# Patient Record
Sex: Female | Born: 1975
Health system: Southern US, Community
[De-identification: ages and names within clinical notes are randomized; demographics above are authoritative.]

## PROBLEM LIST (undated history)

## (undated) DIAGNOSIS — I10 Essential (primary) hypertension: Secondary | ICD-10-CM

## (undated) DIAGNOSIS — D649 Anemia, unspecified: Secondary | ICD-10-CM

## (undated) HISTORY — DX: Essential (primary) hypertension: I10

## (undated) HISTORY — PX: BREAST CYST EXCISION: SHX579

## (undated) HISTORY — DX: Anemia, unspecified: D64.9

---

## 2018-02-05 DIAGNOSIS — L0291 Cutaneous abscess, unspecified: Secondary | ICD-10-CM | POA: Diagnosis not present

## 2018-02-05 DIAGNOSIS — H11009 Unspecified pterygium of unspecified eye: Secondary | ICD-10-CM | POA: Diagnosis not present

## 2018-02-05 DIAGNOSIS — H1589 Other disorders of sclera: Secondary | ICD-10-CM | POA: Diagnosis not present

## 2018-02-05 DIAGNOSIS — I10 Essential (primary) hypertension: Secondary | ICD-10-CM | POA: Diagnosis not present

## 2018-02-06 DIAGNOSIS — I10 Essential (primary) hypertension: Secondary | ICD-10-CM | POA: Diagnosis not present

## 2018-05-24 DIAGNOSIS — Z131 Encounter for screening for diabetes mellitus: Secondary | ICD-10-CM | POA: Diagnosis not present

## 2018-05-24 DIAGNOSIS — I10 Essential (primary) hypertension: Secondary | ICD-10-CM | POA: Diagnosis not present

## 2018-05-24 DIAGNOSIS — Z13220 Encounter for screening for lipoid disorders: Secondary | ICD-10-CM | POA: Diagnosis not present

## 2018-05-24 DIAGNOSIS — D649 Anemia, unspecified: Secondary | ICD-10-CM | POA: Diagnosis not present

## 2019-02-24 DIAGNOSIS — Z13220 Encounter for screening for lipoid disorders: Secondary | ICD-10-CM | POA: Diagnosis not present

## 2019-02-24 DIAGNOSIS — R071 Chest pain on breathing: Secondary | ICD-10-CM | POA: Diagnosis not present

## 2019-02-24 DIAGNOSIS — Z131 Encounter for screening for diabetes mellitus: Secondary | ICD-10-CM | POA: Diagnosis not present

## 2019-02-24 DIAGNOSIS — M25569 Pain in unspecified knee: Secondary | ICD-10-CM | POA: Diagnosis not present

## 2019-02-24 DIAGNOSIS — E559 Vitamin D deficiency, unspecified: Secondary | ICD-10-CM | POA: Diagnosis not present

## 2019-02-24 DIAGNOSIS — I10 Essential (primary) hypertension: Secondary | ICD-10-CM | POA: Diagnosis not present

## 2019-02-25 ENCOUNTER — Other Ambulatory Visit: Payer: Self-pay | Admitting: Internal Medicine

## 2019-02-25 DIAGNOSIS — Z1231 Encounter for screening mammogram for malignant neoplasm of breast: Secondary | ICD-10-CM

## 2019-04-10 ENCOUNTER — Ambulatory Visit
Admission: RE | Admit: 2019-04-10 | Discharge: 2019-04-10 | Disposition: A | Discharge status: Home / Self Care | Payer: BC Managed Care – PPO | Source: Ambulatory Visit | Attending: Internal Medicine | Admitting: Internal Medicine

## 2019-04-10 ENCOUNTER — Other Ambulatory Visit: Payer: Self-pay

## 2019-04-10 DIAGNOSIS — Z1231 Encounter for screening mammogram for malignant neoplasm of breast: Secondary | ICD-10-CM | POA: Diagnosis not present

## 2019-04-14 ENCOUNTER — Other Ambulatory Visit: Payer: Self-pay | Admitting: Internal Medicine

## 2019-04-14 DIAGNOSIS — R928 Other abnormal and inconclusive findings on diagnostic imaging of breast: Secondary | ICD-10-CM

## 2019-04-18 ENCOUNTER — Ambulatory Visit
Admission: RE | Admit: 2019-04-18 | Discharge: 2019-04-18 | Disposition: A | Discharge status: Home / Self Care | Payer: BC Managed Care – PPO | Source: Ambulatory Visit | Attending: Internal Medicine | Admitting: Internal Medicine

## 2019-04-18 ENCOUNTER — Other Ambulatory Visit: Payer: Self-pay

## 2019-04-18 DIAGNOSIS — R928 Other abnormal and inconclusive findings on diagnostic imaging of breast: Secondary | ICD-10-CM

## 2019-04-18 DIAGNOSIS — R922 Inconclusive mammogram: Secondary | ICD-10-CM | POA: Diagnosis not present

## 2019-04-18 DIAGNOSIS — N6002 Solitary cyst of left breast: Secondary | ICD-10-CM | POA: Diagnosis not present

## 2019-05-31 DIAGNOSIS — Z03818 Encounter for observation for suspected exposure to other biological agents ruled out: Secondary | ICD-10-CM | POA: Diagnosis not present

## 2019-06-24 DIAGNOSIS — Z03818 Encounter for observation for suspected exposure to other biological agents ruled out: Secondary | ICD-10-CM | POA: Diagnosis not present

## 2019-07-07 DIAGNOSIS — Z03818 Encounter for observation for suspected exposure to other biological agents ruled out: Secondary | ICD-10-CM | POA: Diagnosis not present

## 2019-07-14 DIAGNOSIS — Z03818 Encounter for observation for suspected exposure to other biological agents ruled out: Secondary | ICD-10-CM | POA: Diagnosis not present

## 2019-08-06 DIAGNOSIS — Z03818 Encounter for observation for suspected exposure to other biological agents ruled out: Secondary | ICD-10-CM | POA: Diagnosis not present

## 2019-08-11 DIAGNOSIS — Z03818 Encounter for observation for suspected exposure to other biological agents ruled out: Secondary | ICD-10-CM | POA: Diagnosis not present

## 2019-08-18 DIAGNOSIS — Z03818 Encounter for observation for suspected exposure to other biological agents ruled out: Secondary | ICD-10-CM | POA: Diagnosis not present

## 2019-08-25 DIAGNOSIS — Z03818 Encounter for observation for suspected exposure to other biological agents ruled out: Secondary | ICD-10-CM | POA: Diagnosis not present

## 2019-10-27 DIAGNOSIS — Z03818 Encounter for observation for suspected exposure to other biological agents ruled out: Secondary | ICD-10-CM | POA: Diagnosis not present

## 2019-11-05 DIAGNOSIS — Z1159 Encounter for screening for other viral diseases: Secondary | ICD-10-CM | POA: Diagnosis not present

## 2019-11-05 DIAGNOSIS — Z20828 Contact with and (suspected) exposure to other viral communicable diseases: Secondary | ICD-10-CM | POA: Diagnosis not present

## 2019-11-12 DIAGNOSIS — Z20828 Contact with and (suspected) exposure to other viral communicable diseases: Secondary | ICD-10-CM | POA: Diagnosis not present

## 2019-11-12 DIAGNOSIS — Z1159 Encounter for screening for other viral diseases: Secondary | ICD-10-CM | POA: Diagnosis not present

## 2019-11-19 DIAGNOSIS — Z20828 Contact with and (suspected) exposure to other viral communicable diseases: Secondary | ICD-10-CM | POA: Diagnosis not present

## 2019-11-19 DIAGNOSIS — Z1159 Encounter for screening for other viral diseases: Secondary | ICD-10-CM | POA: Diagnosis not present

## 2019-12-03 DIAGNOSIS — Z20828 Contact with and (suspected) exposure to other viral communicable diseases: Secondary | ICD-10-CM | POA: Diagnosis not present

## 2019-12-03 DIAGNOSIS — Z1159 Encounter for screening for other viral diseases: Secondary | ICD-10-CM | POA: Diagnosis not present

## 2019-12-10 DIAGNOSIS — Z1159 Encounter for screening for other viral diseases: Secondary | ICD-10-CM | POA: Diagnosis not present

## 2019-12-10 DIAGNOSIS — Z20828 Contact with and (suspected) exposure to other viral communicable diseases: Secondary | ICD-10-CM | POA: Diagnosis not present

## 2019-12-17 DIAGNOSIS — Z1159 Encounter for screening for other viral diseases: Secondary | ICD-10-CM | POA: Diagnosis not present

## 2019-12-17 DIAGNOSIS — Z20828 Contact with and (suspected) exposure to other viral communicable diseases: Secondary | ICD-10-CM | POA: Diagnosis not present

## 2019-12-24 DIAGNOSIS — Z20828 Contact with and (suspected) exposure to other viral communicable diseases: Secondary | ICD-10-CM | POA: Diagnosis not present

## 2019-12-24 DIAGNOSIS — Z1159 Encounter for screening for other viral diseases: Secondary | ICD-10-CM | POA: Diagnosis not present

## 2019-12-31 DIAGNOSIS — Z20828 Contact with and (suspected) exposure to other viral communicable diseases: Secondary | ICD-10-CM | POA: Diagnosis not present

## 2019-12-31 DIAGNOSIS — Z1159 Encounter for screening for other viral diseases: Secondary | ICD-10-CM | POA: Diagnosis not present

## 2020-01-07 DIAGNOSIS — Z1159 Encounter for screening for other viral diseases: Secondary | ICD-10-CM | POA: Diagnosis not present

## 2020-01-07 DIAGNOSIS — Z20828 Contact with and (suspected) exposure to other viral communicable diseases: Secondary | ICD-10-CM | POA: Diagnosis not present

## 2020-01-21 DIAGNOSIS — Z1159 Encounter for screening for other viral diseases: Secondary | ICD-10-CM | POA: Diagnosis not present

## 2020-01-21 DIAGNOSIS — Z20828 Contact with and (suspected) exposure to other viral communicable diseases: Secondary | ICD-10-CM | POA: Diagnosis not present

## 2020-01-28 DIAGNOSIS — Z1159 Encounter for screening for other viral diseases: Secondary | ICD-10-CM | POA: Diagnosis not present

## 2020-01-28 DIAGNOSIS — Z20828 Contact with and (suspected) exposure to other viral communicable diseases: Secondary | ICD-10-CM | POA: Diagnosis not present

## 2020-02-04 DIAGNOSIS — Z20828 Contact with and (suspected) exposure to other viral communicable diseases: Secondary | ICD-10-CM | POA: Diagnosis not present

## 2020-02-04 DIAGNOSIS — Z1159 Encounter for screening for other viral diseases: Secondary | ICD-10-CM | POA: Diagnosis not present

## 2020-02-10 DIAGNOSIS — I10 Essential (primary) hypertension: Secondary | ICD-10-CM | POA: Diagnosis not present

## 2020-02-10 DIAGNOSIS — Z0000 Encounter for general adult medical examination without abnormal findings: Secondary | ICD-10-CM | POA: Diagnosis not present

## 2020-02-10 DIAGNOSIS — Z1239 Encounter for other screening for malignant neoplasm of breast: Secondary | ICD-10-CM | POA: Diagnosis not present

## 2020-02-10 DIAGNOSIS — Z124 Encounter for screening for malignant neoplasm of cervix: Secondary | ICD-10-CM | POA: Diagnosis not present

## 2020-02-10 DIAGNOSIS — Z131 Encounter for screening for diabetes mellitus: Secondary | ICD-10-CM | POA: Diagnosis not present

## 2020-02-10 DIAGNOSIS — D649 Anemia, unspecified: Secondary | ICD-10-CM | POA: Diagnosis not present

## 2020-02-16 DIAGNOSIS — I10 Essential (primary) hypertension: Secondary | ICD-10-CM | POA: Diagnosis not present

## 2020-02-16 DIAGNOSIS — Z13220 Encounter for screening for lipoid disorders: Secondary | ICD-10-CM | POA: Diagnosis not present

## 2020-02-16 DIAGNOSIS — N3080 Other cystitis without hematuria: Secondary | ICD-10-CM | POA: Diagnosis not present

## 2020-02-16 DIAGNOSIS — N3081 Other cystitis with hematuria: Secondary | ICD-10-CM | POA: Diagnosis not present

## 2020-02-16 DIAGNOSIS — E559 Vitamin D deficiency, unspecified: Secondary | ICD-10-CM | POA: Diagnosis not present

## 2020-02-16 DIAGNOSIS — Z0000 Encounter for general adult medical examination without abnormal findings: Secondary | ICD-10-CM | POA: Diagnosis not present

## 2020-02-18 DIAGNOSIS — Z20828 Contact with and (suspected) exposure to other viral communicable diseases: Secondary | ICD-10-CM | POA: Diagnosis not present

## 2020-02-18 DIAGNOSIS — Z1159 Encounter for screening for other viral diseases: Secondary | ICD-10-CM | POA: Diagnosis not present

## 2020-02-25 DIAGNOSIS — Z20828 Contact with and (suspected) exposure to other viral communicable diseases: Secondary | ICD-10-CM | POA: Diagnosis not present

## 2020-02-25 DIAGNOSIS — Z1159 Encounter for screening for other viral diseases: Secondary | ICD-10-CM | POA: Diagnosis not present

## 2020-02-26 ENCOUNTER — Encounter: Payer: Self-pay | Admitting: Radiology

## 2020-03-03 DIAGNOSIS — Z1159 Encounter for screening for other viral diseases: Secondary | ICD-10-CM | POA: Diagnosis not present

## 2020-03-03 DIAGNOSIS — Z20828 Contact with and (suspected) exposure to other viral communicable diseases: Secondary | ICD-10-CM | POA: Diagnosis not present

## 2020-03-10 DIAGNOSIS — Z1159 Encounter for screening for other viral diseases: Secondary | ICD-10-CM | POA: Diagnosis not present

## 2020-03-10 DIAGNOSIS — Z20828 Contact with and (suspected) exposure to other viral communicable diseases: Secondary | ICD-10-CM | POA: Diagnosis not present

## 2020-03-17 ENCOUNTER — Encounter: Payer: BC Managed Care – PPO | Admitting: Family Medicine

## 2020-03-23 ENCOUNTER — Encounter: Payer: BC Managed Care – PPO | Admitting: Obstetrics and Gynecology

## 2020-03-24 DIAGNOSIS — Z1159 Encounter for screening for other viral diseases: Secondary | ICD-10-CM | POA: Diagnosis not present

## 2020-03-24 DIAGNOSIS — Z20828 Contact with and (suspected) exposure to other viral communicable diseases: Secondary | ICD-10-CM | POA: Diagnosis not present

## 2020-04-05 DIAGNOSIS — M5489 Other dorsalgia: Secondary | ICD-10-CM | POA: Diagnosis not present

## 2020-04-05 DIAGNOSIS — I10 Essential (primary) hypertension: Secondary | ICD-10-CM | POA: Diagnosis not present

## 2020-04-05 DIAGNOSIS — E669 Obesity, unspecified: Secondary | ICD-10-CM | POA: Diagnosis not present

## 2020-04-05 DIAGNOSIS — D649 Anemia, unspecified: Secondary | ICD-10-CM | POA: Diagnosis not present

## 2020-04-07 DIAGNOSIS — Z1159 Encounter for screening for other viral diseases: Secondary | ICD-10-CM | POA: Diagnosis not present

## 2020-04-07 DIAGNOSIS — Z20828 Contact with and (suspected) exposure to other viral communicable diseases: Secondary | ICD-10-CM | POA: Diagnosis not present

## 2020-04-13 DIAGNOSIS — Z20828 Contact with and (suspected) exposure to other viral communicable diseases: Secondary | ICD-10-CM | POA: Diagnosis not present

## 2020-04-13 DIAGNOSIS — Z1159 Encounter for screening for other viral diseases: Secondary | ICD-10-CM | POA: Diagnosis not present

## 2020-04-15 ENCOUNTER — Encounter: Payer: Self-pay | Admitting: Family Medicine

## 2020-04-15 ENCOUNTER — Other Ambulatory Visit (HOSPITAL_COMMUNITY)
Admission: RE | Admit: 2020-04-15 | Discharge: 2020-04-15 | Disposition: A | Discharge status: Home / Self Care | Payer: BC Managed Care – PPO | Source: Ambulatory Visit | Attending: Family Medicine | Admitting: Family Medicine

## 2020-04-15 ENCOUNTER — Other Ambulatory Visit: Payer: Self-pay

## 2020-04-15 ENCOUNTER — Ambulatory Visit (INDEPENDENT_AMBULATORY_CARE_PROVIDER_SITE_OTHER): Payer: BC Managed Care – PPO | Admitting: Family Medicine

## 2020-04-15 DIAGNOSIS — Z01419 Encounter for gynecological examination (general) (routine) without abnormal findings: Secondary | ICD-10-CM

## 2020-04-15 DIAGNOSIS — Z124 Encounter for screening for malignant neoplasm of cervix: Secondary | ICD-10-CM

## 2020-04-15 DIAGNOSIS — I10 Essential (primary) hypertension: Secondary | ICD-10-CM | POA: Insufficient documentation

## 2020-04-15 NOTE — Progress Notes (Signed)
  Subjective:     Vanessa Gates is a 44 y.o. female and is here for a comprehensive physical exam. The patient reports no problems.   The following portions of the patient's history were reviewed and updated as appropriate: allergies, current medications, past family history, past medical history, past social history, past surgical history and problem list.  Review of Systems Pertinent items noted in HPI and remainder of comprehensive ROS otherwise negative.   Objective:    BP (!) 142/98   Pulse 98   Ht 5\' 5"  (1.651 m)   Wt 185 lb (83.9 kg)   LMP 03/31/2020 (Exact Date)   BMI 30.79 kg/m  General appearance: alert, cooperative and appears stated age Head: Normocephalic, without obvious abnormality, atraumatic Neck: no adenopathy, supple, symmetrical, trachea midline and thyroid not enlarged, symmetric, no tenderness/mass/nodules Lungs: clear to auscultation bilaterally Breasts: normal appearance, no masses or tenderness Heart: regular rate and rhythm, S1, S2 normal, no murmur, click, rub or gallop Abdomen: soft, non-tender; bowel sounds normal; no masses,  no organomegaly Pelvic: cervix normal in appearance, external genitalia normal, no adnexal masses or tenderness, no cervical motion tenderness, uterus normal size, shape, and consistency and vagina normal without discharge Extremities: extremities normal, atraumatic, no cyanosis or edema Pulses: 2+ and symmetric Skin: Skin color, texture, turgor normal. No rashes or lesions Lymph nodes: Cervical, supraclavicular, and axillary nodes normal. Neurologic: Grossly normal    Assessment:    Healthy female exam.      Plan:  Screening for malignant neoplasm of cervix - Plan: Cytology - PAP  Encounter for gynecological examination without abnormal finding  Benign essential hypertension  On Norvasc--has PCP Declines mammogram and flu shot today Return in 1 year (on 04/15/2021).     See After Visit Summary for Counseling  Recommendations

## 2020-04-15 NOTE — Patient Instructions (Signed)
 Preventive Care 21-43 Years Old, Female Preventive care refers to visits with your health care provider and lifestyle choices that can promote health and wellness. This includes:  A yearly physical exam. This may also be called an annual well check.  Regular dental visits and eye exams.  Immunizations.  Screening for certain conditions.  Healthy lifestyle choices, such as eating a healthy diet, getting regular exercise, not using drugs or products that contain nicotine and tobacco, and limiting alcohol use. What can I expect for my preventive care visit? Physical exam Your health care provider will check your:  Height and weight. This may be used to calculate body mass index (BMI), which tells if you are at a healthy weight.  Heart rate and blood pressure.  Skin for abnormal spots. Counseling Your health care provider may ask you questions about your:  Alcohol, tobacco, and drug use.  Emotional well-being.  Home and relationship well-being.  Sexual activity.  Eating habits.  Work and work environment.  Method of birth control.  Menstrual cycle.  Pregnancy history. What immunizations do I need?  Influenza (flu) vaccine  This is recommended every year. Tetanus, diphtheria, and pertussis (Tdap) vaccine  You may need a Td booster every 10 years. Varicella (chickenpox) vaccine  You may need this if you have not been vaccinated. Human papillomavirus (HPV) vaccine  If recommended by your health care provider, you may need three doses over 6 months. Measles, mumps, and rubella (MMR) vaccine  You may need at least one dose of MMR. You may also need a second dose. Meningococcal conjugate (MenACWY) vaccine  One dose is recommended if you are age 19-21 years and a first-year college student living in a residence hall, or if you have one of several medical conditions. You may also need additional booster doses. Pneumococcal conjugate (PCV13) vaccine  You may need  this if you have certain conditions and were not previously vaccinated. Pneumococcal polysaccharide (PPSV23) vaccine  You may need one or two doses if you smoke cigarettes or if you have certain conditions. Hepatitis A vaccine  You may need this if you have certain conditions or if you travel or work in places where you may be exposed to hepatitis A. Hepatitis B vaccine  You may need this if you have certain conditions or if you travel or work in places where you may be exposed to hepatitis B. Haemophilus influenzae type b (Hib) vaccine  You may need this if you have certain conditions. You may receive vaccines as individual doses or as more than one vaccine together in one shot (combination vaccines). Talk with your health care provider about the risks and benefits of combination vaccines. What tests do I need?  Blood tests  Lipid and cholesterol levels. These may be checked every 5 years starting at age 20.  Hepatitis C test.  Hepatitis B test. Screening  Diabetes screening. This is done by checking your blood sugar (glucose) after you have not eaten for a while (fasting).  Sexually transmitted disease (STD) testing.  BRCA-related cancer screening. This may be done if you have a family history of breast, ovarian, tubal, or peritoneal cancers.  Pelvic exam and Pap test. This may be done every 3 years starting at age 21. Starting at age 30, this may be done every 5 years if you have a Pap test in combination with an HPV test. Talk with your health care provider about your test results, treatment options, and if necessary, the need for more   tests. Follow these instructions at home: Eating and drinking   Eat a diet that includes fresh fruits and vegetables, whole grains, lean protein, and low-fat dairy.  Take vitamin and mineral supplements as recommended by your health care provider.  Do not drink alcohol if: ? Your health care provider tells you not to drink. ? You are  pregnant, may be pregnant, or are planning to become pregnant.  If you drink alcohol: ? Limit how much you have to 0-1 drink a day. ? Be aware of how much alcohol is in your drink. In the [place] one drink equals one 12 oz bottle of beer (355 mL), one 5 oz glass of wine (148 mL), or one 1 oz glass of hard liquor (44 mL). Lifestyle  Take daily care of your teeth and gums.  Stay active. Exercise for at least 30 minutes on 5 or more days each week.  Do not use any products that contain nicotine or tobacco, such as cigarettes, e-cigarettes, and chewing tobacco. If you need help quitting, ask your health care provider.  If you are sexually active, practice safe sex. Use a condom or other form of birth control (contraception) in order to prevent pregnancy and STIs (sexually transmitted infections). If you plan to become pregnant, see your health care provider for a preconception visit. What's next?  Visit your health care provider once a year for a well check visit.  Ask your health care provider how often you should have your eyes and teeth checked.  Stay up to date on all vaccines. This information is not intended to replace advice given to you by your health care provider. Make sure you discuss any questions you have with your health care provider. Document Revised: 03/21/2018 Document Reviewed: 03/21/2018 Elsevier Patient Education  2020 [employer]  

## 2020-04-16 DIAGNOSIS — Z20828 Contact with and (suspected) exposure to other viral communicable diseases: Secondary | ICD-10-CM | POA: Diagnosis not present

## 2020-04-16 DIAGNOSIS — Z1159 Encounter for screening for other viral diseases: Secondary | ICD-10-CM | POA: Diagnosis not present

## 2020-04-19 LAB — CYTOLOGY - PAP
Adequacy: ABSENT
Comment: NEGATIVE
Diagnosis: NEGATIVE
High risk HPV: NEGATIVE

## 2020-04-20 DIAGNOSIS — Z20828 Contact with and (suspected) exposure to other viral communicable diseases: Secondary | ICD-10-CM | POA: Diagnosis not present

## 2020-04-20 DIAGNOSIS — Z1159 Encounter for screening for other viral diseases: Secondary | ICD-10-CM | POA: Diagnosis not present

## 2020-04-23 DIAGNOSIS — Z1159 Encounter for screening for other viral diseases: Secondary | ICD-10-CM | POA: Diagnosis not present

## 2020-04-23 DIAGNOSIS — Z20828 Contact with and (suspected) exposure to other viral communicable diseases: Secondary | ICD-10-CM | POA: Diagnosis not present

## 2020-04-23 IMAGING — MG MM DIGITAL DIAGNOSTIC UNILAT*L* W/ TOMO W/ CAD
8 series · 8 of 24 positions shown · non-contrast
Comparison: Previous exam(s).

CLINICAL DATA: 42-year-old female for further evaluation of
possible LEFT breast mass and LEFT breast asymmetry.

EXAM:
DIGITAL DIAGNOSTIC LEFT MAMMOGRAM WITH CAD AND TOMO
ULTRASOUND LEFT BREAST

[L ML synth-2D]
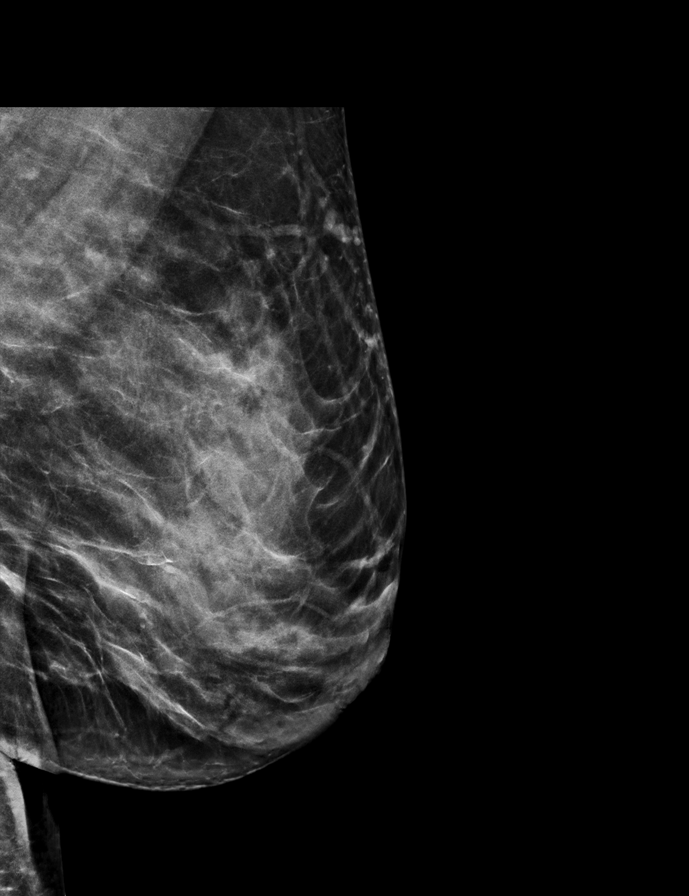

[L MLO synth-2D]
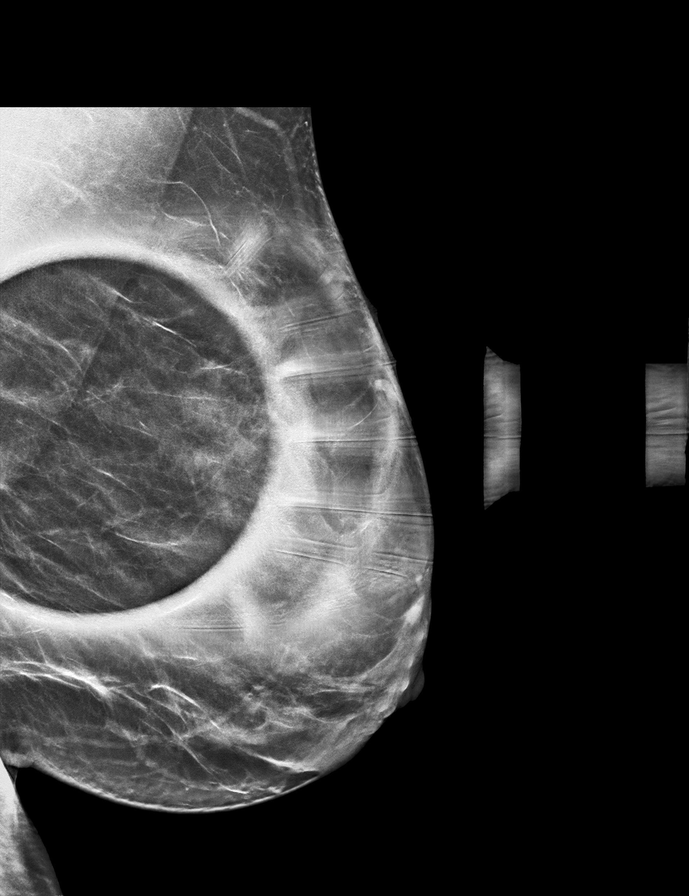

[L CC synth-2D (1 of 2)]
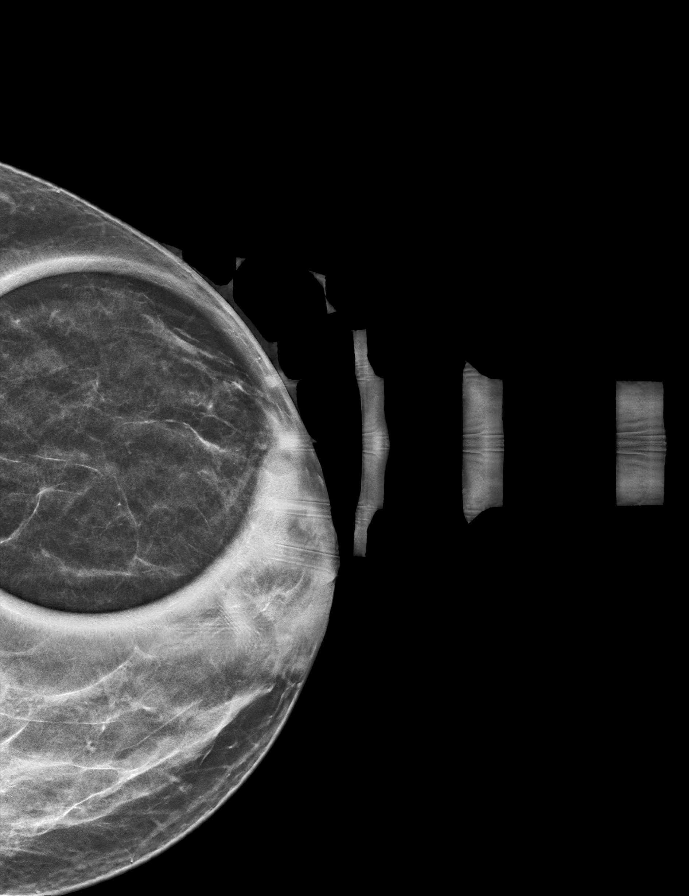

[L CC synth-2D (2 of 2)]
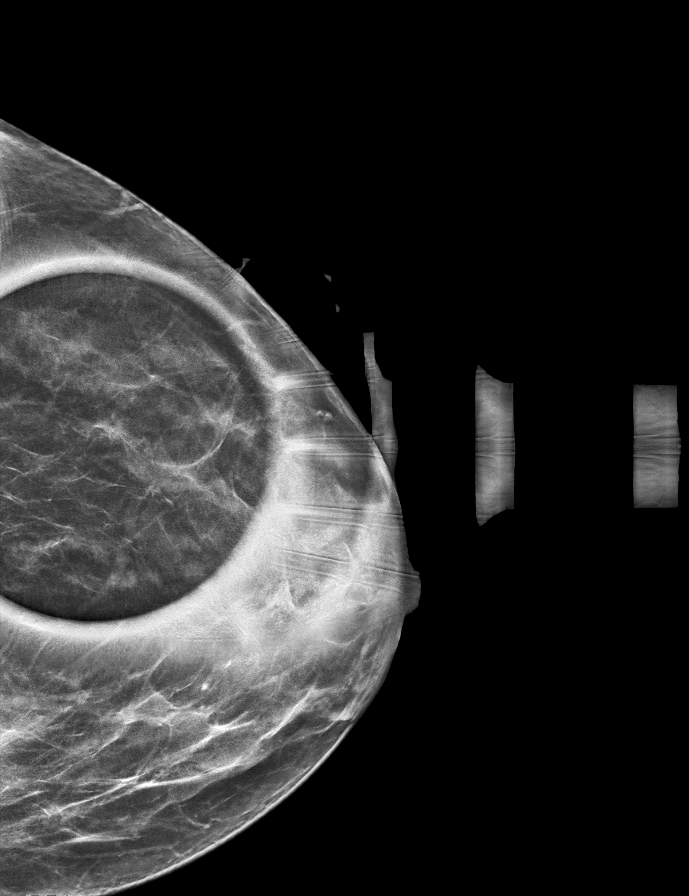

[L ML tomo · tomo slice 35/68.0]
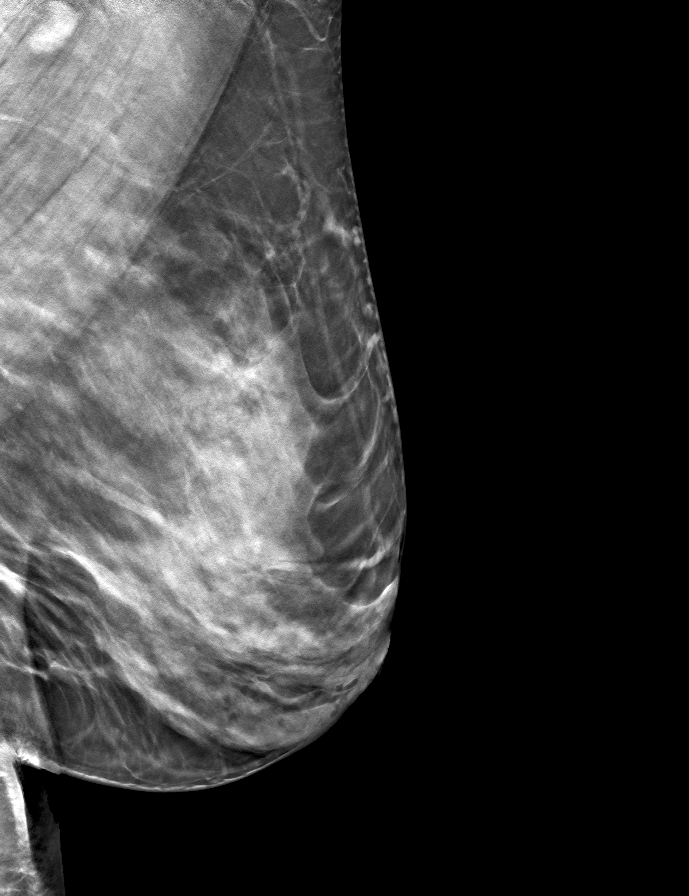

[L MLO tomo · tomo slice 33/65.0]
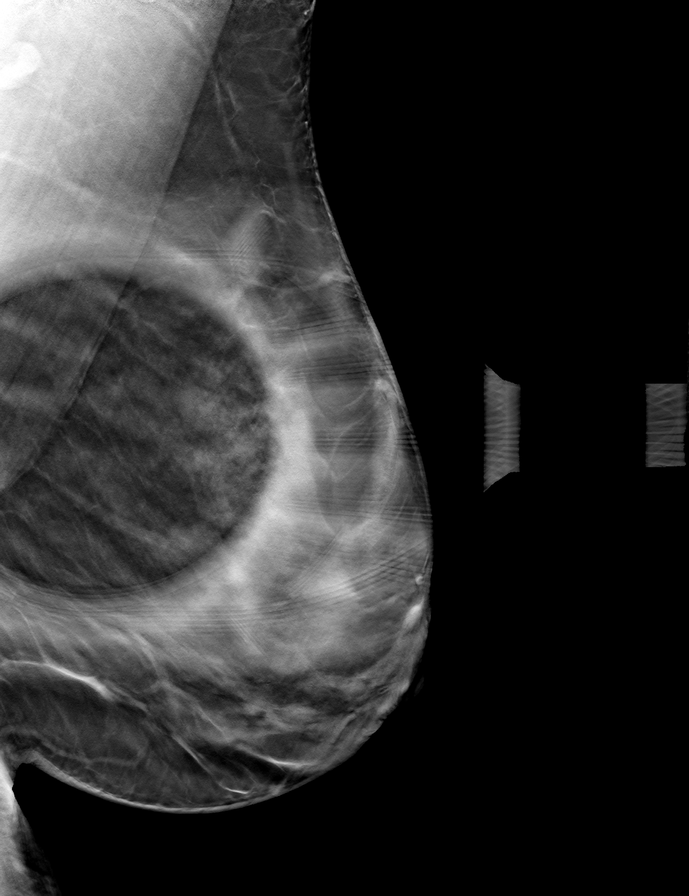

[L CC tomo (1 of 2) · tomo slice 28/55.0]
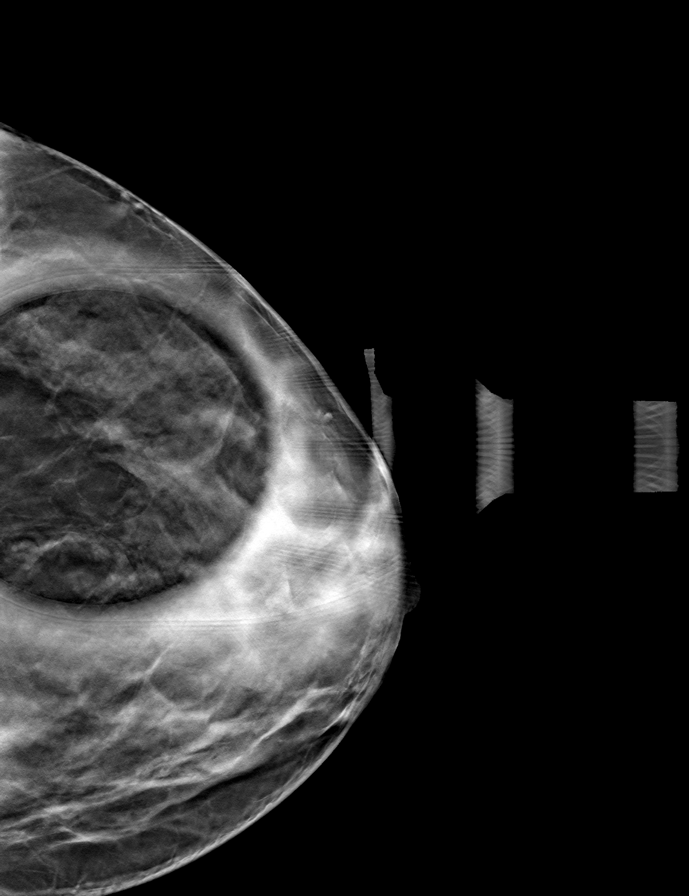

[L CC tomo (2 of 2) · tomo slice 21/42.0]
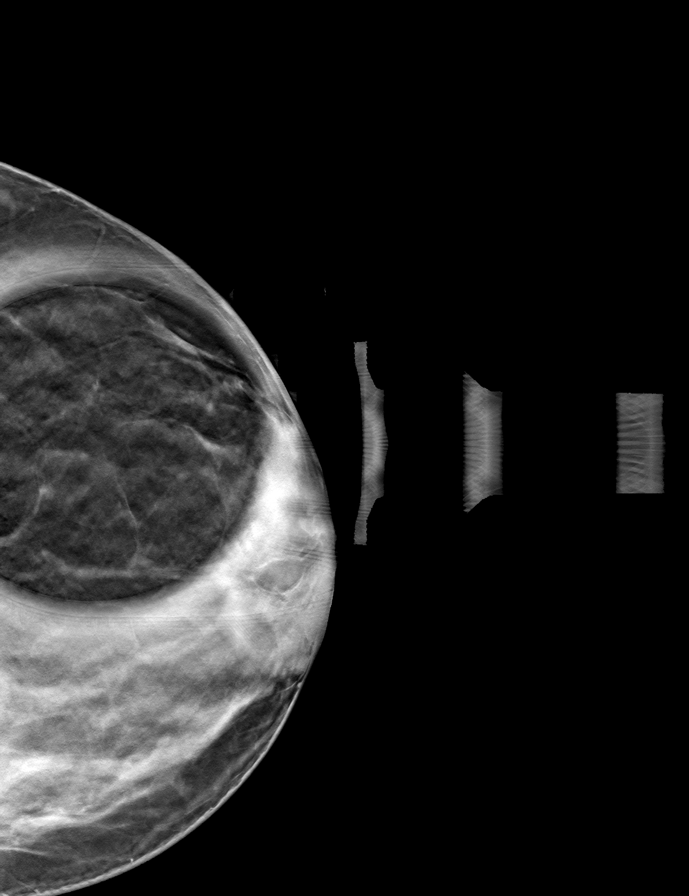

[8 of 24 positions shown; findings below may reference images not displayed]

ACR Breast Density Category c: The breast tissue is heterogeneously
dense, which may obscure small masses.
FINDINGS: 2D/3D full field and spot compression views of the LEFT breast
demonstrate a persistent circumscribed oval mass within the OUTER
LEFT breast.

No persistent suspicious abnormality is identified within the
posterior LEFT breast in the area of the possible screening study
asymmetry.

Mammographic images were processed with CAD.

Targeted ultrasound is performed, showing a 0.6 x 0.6 x 0.9 cm
benign cyst at the 3 o'clock position of the LEFT breast 7 cm from
the nipple, corresponding to the screening study finding.
IMPRESSION: 1. Benign cyst within the OUTER LEFT breast corresponding to the
screening study finding.
2. No persistent abnormality at the site of the possible screening
study asymmetry within the posterior LEFT breast .

RECOMMENDATION:
Bilateral screening mammogram in 1 year.

I have discussed the findings and recommendations with the patient.
If applicable, a reminder letter will be sent to the patient
regarding the next appointment.

BI-RADS CATEGORY  2: Benign.

## 2020-04-23 IMAGING — US US BREAST*L* LIMITED INC AXILLA
1 series · 5 of 5 positions shown · non-contrast
Comparison: Previous exam(s).

CLINICAL DATA: 42-year-old female for further evaluation of
possible LEFT breast mass and LEFT breast asymmetry.

EXAM:
DIGITAL DIAGNOSTIC LEFT MAMMOGRAM WITH CAD AND TOMO
ULTRASOUND LEFT BREAST

[Series 1: us breast*left* limited inc axilla · 0.06mm/px · 5 of 5 slices shown]
[im 1/5]
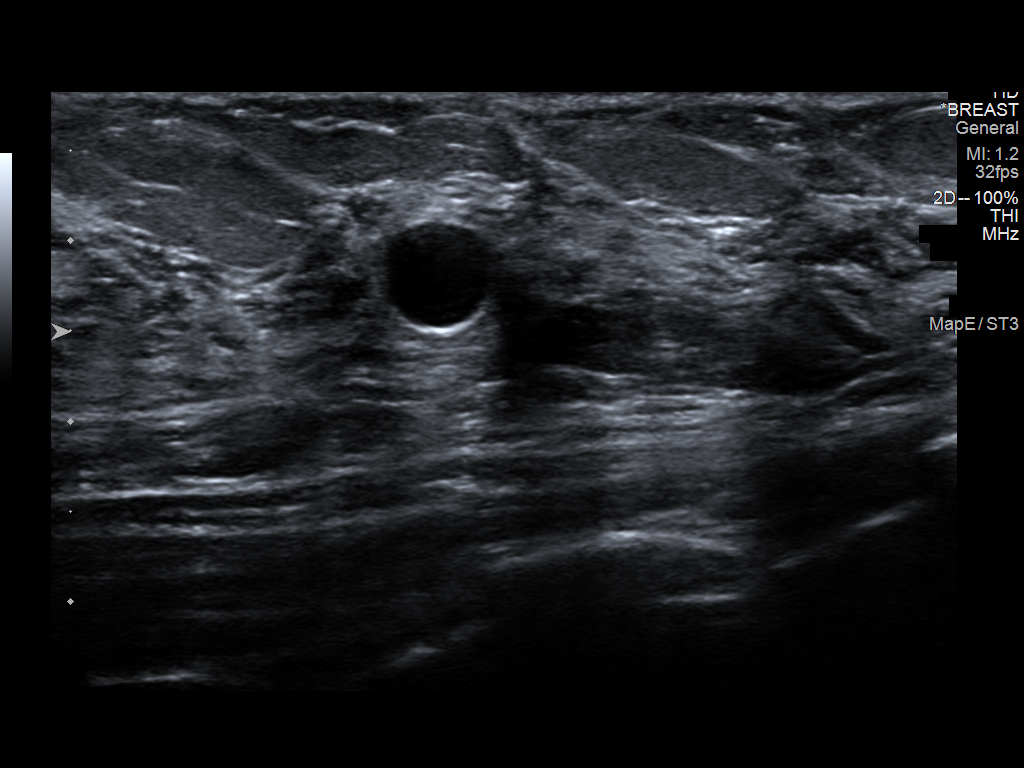
[im 2/5]
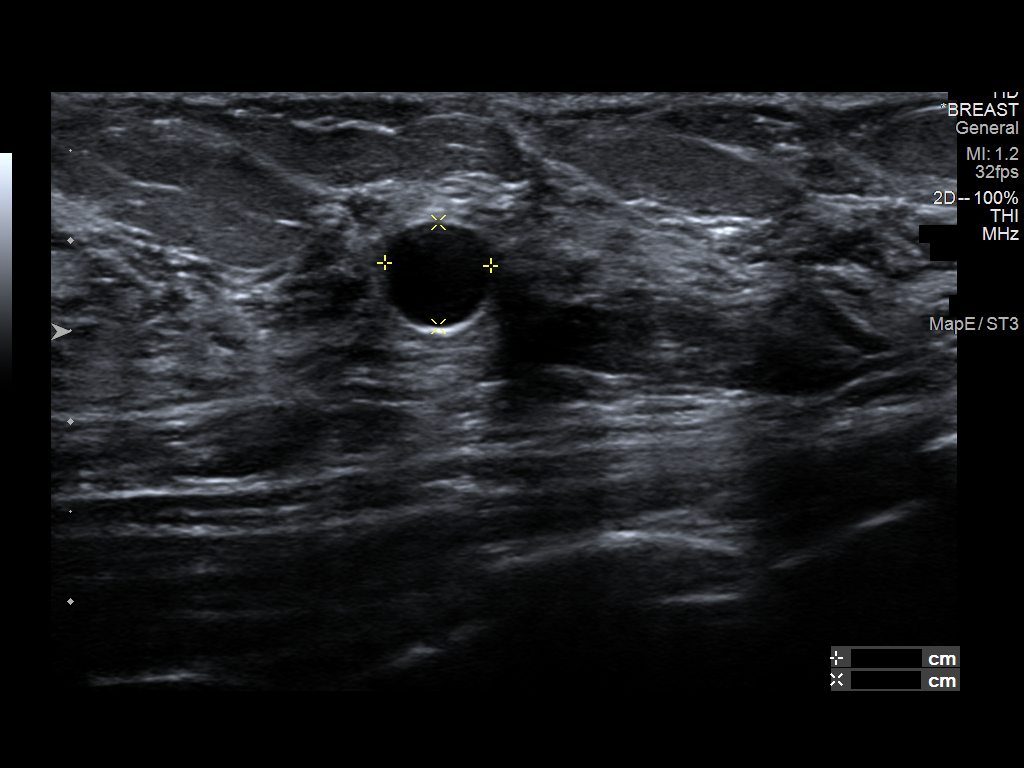
[im 3/5]
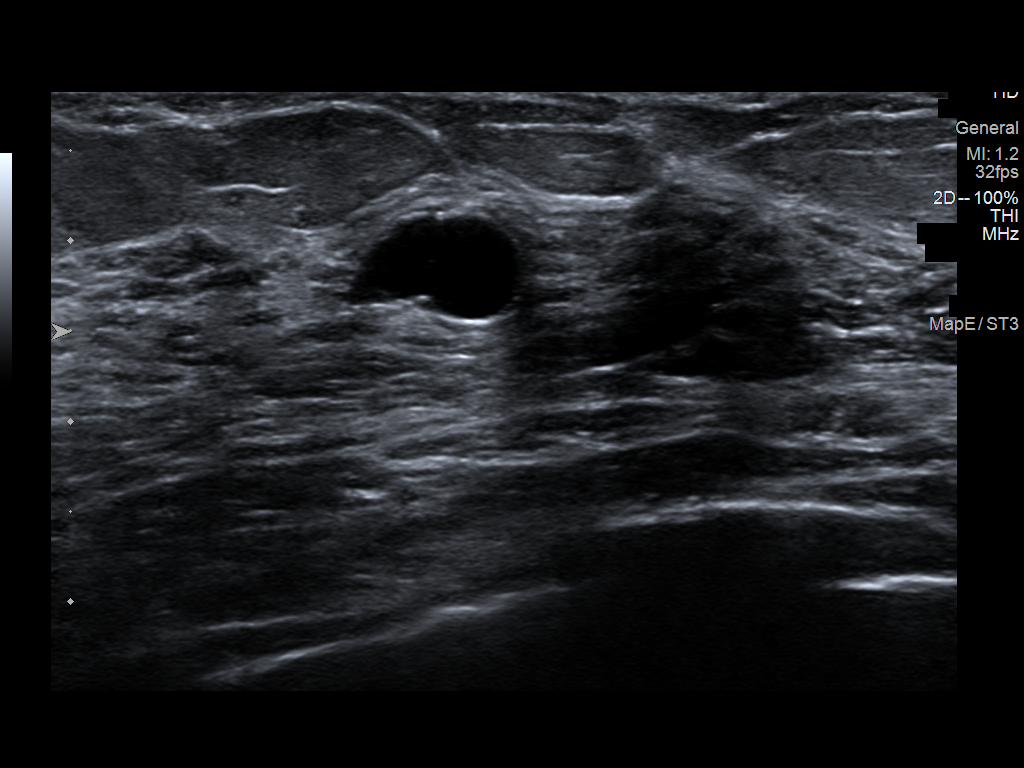
[im 4/5]
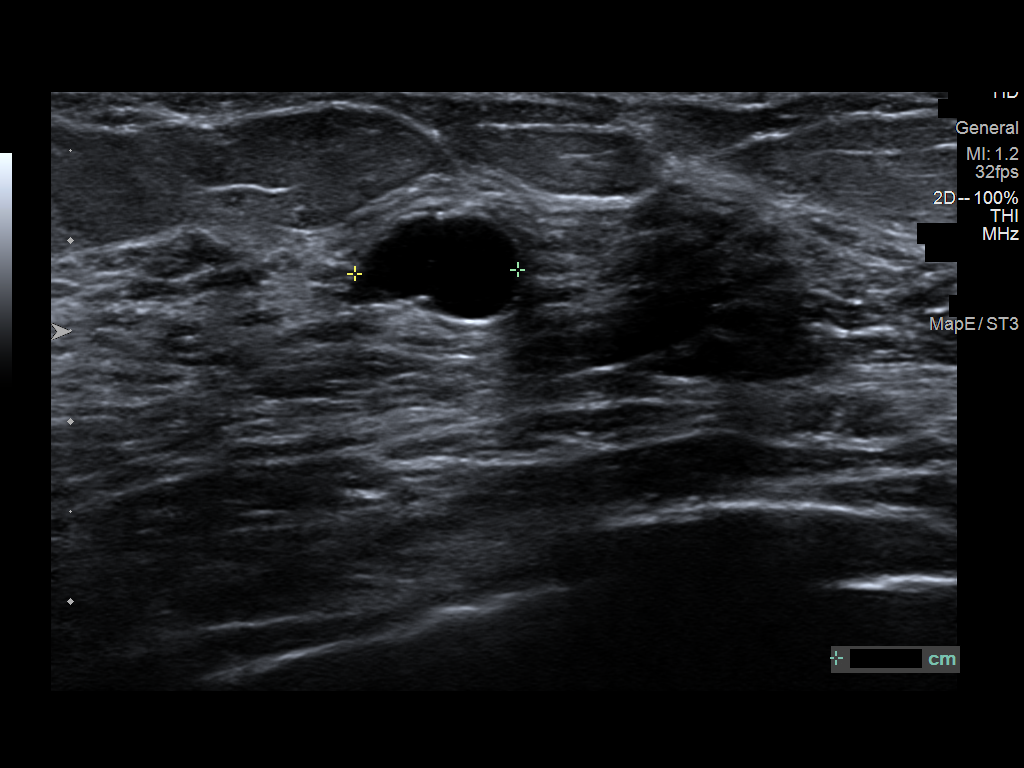
[im 5/5]
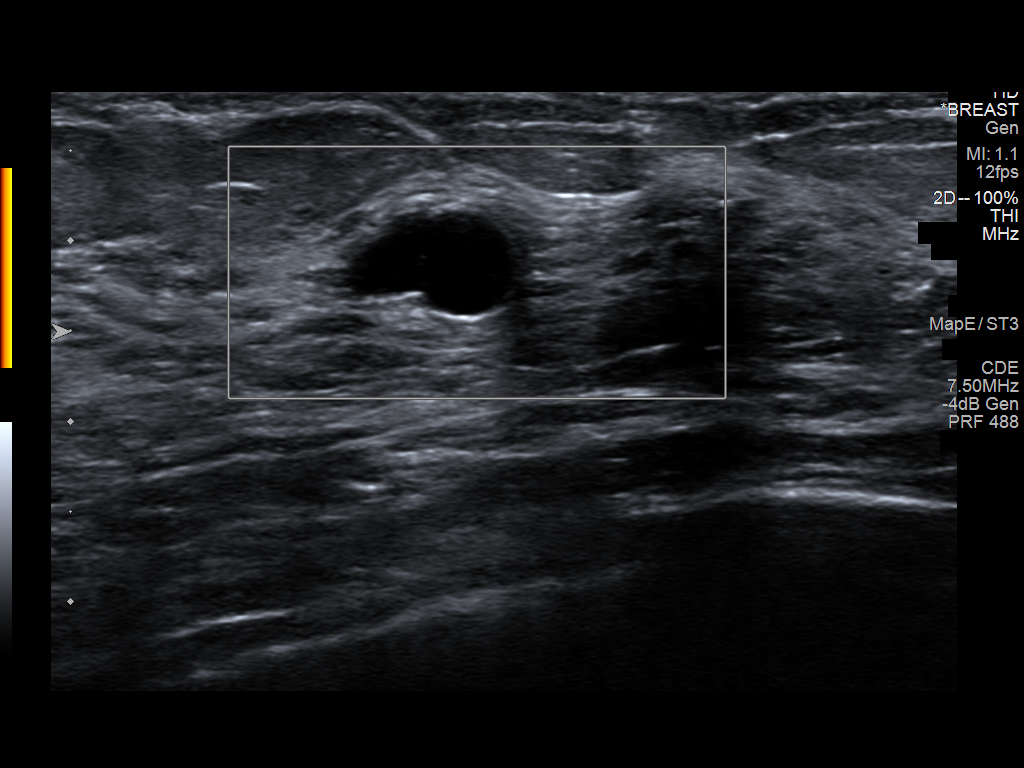

[5 of 5 positions shown; findings below may reference images not displayed]

ACR Breast Density Category c: The breast tissue is heterogeneously
dense, which may obscure small masses.
FINDINGS: 2D/3D full field and spot compression views of the LEFT breast
demonstrate a persistent circumscribed oval mass within the OUTER
LEFT breast.

No persistent suspicious abnormality is identified within the
posterior LEFT breast in the area of the possible screening study
asymmetry.

Mammographic images were processed with CAD.

Targeted ultrasound is performed, showing a 0.6 x 0.6 x 0.9 cm
benign cyst at the 3 o'clock position of the LEFT breast 7 cm from
the nipple, corresponding to the screening study finding.
IMPRESSION: 1. Benign cyst within the OUTER LEFT breast corresponding to the
screening study finding.
2. No persistent abnormality at the site of the possible screening
study asymmetry within the posterior LEFT breast .

RECOMMENDATION:
Bilateral screening mammogram in 1 year.

I have discussed the findings and recommendations with the patient.
If applicable, a reminder letter will be sent to the patient
regarding the next appointment.

BI-RADS CATEGORY  2: Benign.

## 2020-04-27 DIAGNOSIS — Z1159 Encounter for screening for other viral diseases: Secondary | ICD-10-CM | POA: Diagnosis not present

## 2020-04-27 DIAGNOSIS — Z20828 Contact with and (suspected) exposure to other viral communicable diseases: Secondary | ICD-10-CM | POA: Diagnosis not present

## 2020-05-07 DIAGNOSIS — Z1159 Encounter for screening for other viral diseases: Secondary | ICD-10-CM | POA: Diagnosis not present

## 2020-05-07 DIAGNOSIS — Z20828 Contact with and (suspected) exposure to other viral communicable diseases: Secondary | ICD-10-CM | POA: Diagnosis not present

## 2020-05-14 DIAGNOSIS — Z1159 Encounter for screening for other viral diseases: Secondary | ICD-10-CM | POA: Diagnosis not present

## 2020-05-14 DIAGNOSIS — Z20828 Contact with and (suspected) exposure to other viral communicable diseases: Secondary | ICD-10-CM | POA: Diagnosis not present

## 2020-05-25 DIAGNOSIS — Z1159 Encounter for screening for other viral diseases: Secondary | ICD-10-CM | POA: Diagnosis not present

## 2020-05-25 DIAGNOSIS — Z20828 Contact with and (suspected) exposure to other viral communicable diseases: Secondary | ICD-10-CM | POA: Diagnosis not present

## 2020-06-11 DIAGNOSIS — Z1159 Encounter for screening for other viral diseases: Secondary | ICD-10-CM | POA: Diagnosis not present

## 2020-06-11 DIAGNOSIS — Z20828 Contact with and (suspected) exposure to other viral communicable diseases: Secondary | ICD-10-CM | POA: Diagnosis not present

## 2020-07-02 DIAGNOSIS — Z20828 Contact with and (suspected) exposure to other viral communicable diseases: Secondary | ICD-10-CM | POA: Diagnosis not present

## 2020-07-02 DIAGNOSIS — Z1159 Encounter for screening for other viral diseases: Secondary | ICD-10-CM | POA: Diagnosis not present

## 2020-07-06 DIAGNOSIS — Z1159 Encounter for screening for other viral diseases: Secondary | ICD-10-CM | POA: Diagnosis not present

## 2020-07-06 DIAGNOSIS — Z20828 Contact with and (suspected) exposure to other viral communicable diseases: Secondary | ICD-10-CM | POA: Diagnosis not present

## 2020-07-15 DIAGNOSIS — Z20828 Contact with and (suspected) exposure to other viral communicable diseases: Secondary | ICD-10-CM | POA: Diagnosis not present

## 2020-07-15 DIAGNOSIS — Z1159 Encounter for screening for other viral diseases: Secondary | ICD-10-CM | POA: Diagnosis not present

## 2020-07-20 DIAGNOSIS — Z1159 Encounter for screening for other viral diseases: Secondary | ICD-10-CM | POA: Diagnosis not present

## 2020-07-20 DIAGNOSIS — Z20828 Contact with and (suspected) exposure to other viral communicable diseases: Secondary | ICD-10-CM | POA: Diagnosis not present

## 2020-07-23 DIAGNOSIS — Z20828 Contact with and (suspected) exposure to other viral communicable diseases: Secondary | ICD-10-CM | POA: Diagnosis not present

## 2020-07-23 DIAGNOSIS — Z1159 Encounter for screening for other viral diseases: Secondary | ICD-10-CM | POA: Diagnosis not present

## 2021-01-10 ENCOUNTER — Other Ambulatory Visit: Payer: Self-pay | Admitting: Internal Medicine

## 2021-01-10 DIAGNOSIS — M5459 Other low back pain: Secondary | ICD-10-CM | POA: Diagnosis not present

## 2021-01-10 DIAGNOSIS — Z13220 Encounter for screening for lipoid disorders: Secondary | ICD-10-CM | POA: Diagnosis not present

## 2021-01-10 DIAGNOSIS — I10 Essential (primary) hypertension: Secondary | ICD-10-CM | POA: Diagnosis not present

## 2021-01-10 DIAGNOSIS — E559 Vitamin D deficiency, unspecified: Secondary | ICD-10-CM | POA: Diagnosis not present

## 2021-01-10 DIAGNOSIS — Z0000 Encounter for general adult medical examination without abnormal findings: Secondary | ICD-10-CM | POA: Diagnosis not present

## 2021-01-10 DIAGNOSIS — Z131 Encounter for screening for diabetes mellitus: Secondary | ICD-10-CM | POA: Diagnosis not present

## 2021-01-11 LAB — TSH: TSH: 1.74 mIU/L

## 2021-01-11 LAB — CBC
HCT: 34.7 % — ABNORMAL LOW (ref 35.0–45.0)
Hemoglobin: 10.4 g/dL — ABNORMAL LOW (ref 11.7–15.5)
MCH: 21.6 pg — ABNORMAL LOW (ref 27.0–33.0)
MCHC: 30 g/dL — ABNORMAL LOW (ref 32.0–36.0)
MCV: 72.1 fL — ABNORMAL LOW (ref 80.0–100.0)
MPV: 12.5 fL (ref 7.5–12.5)
Platelets: 202 10*3/uL (ref 140–400)
RBC: 4.81 10*6/uL (ref 3.80–5.10)
RDW: 14.5 % (ref 11.0–15.0)
WBC: 6.4 10*3/uL (ref 3.8–10.8)

## 2021-01-11 LAB — COMPLETE METABOLIC PANEL WITH GFR
AG Ratio: 1.4 (calc) (ref 1.0–2.5)
ALT: 10 U/L (ref 6–29)
AST: 17 U/L (ref 10–30)
Albumin: 4.3 g/dL (ref 3.6–5.1)
Alkaline phosphatase (APISO): 33 U/L (ref 31–125)
BUN: 16 mg/dL (ref 7–25)
CO2: 25 mmol/L (ref 20–32)
Calcium: 9 mg/dL (ref 8.6–10.2)
Chloride: 102 mmol/L (ref 98–110)
Creat: 1.01 mg/dL (ref 0.50–1.10)
GFR, Est African American: 79 mL/min/{1.73_m2} (ref >=60)
GFR, Est Non African American: 68 mL/min/{1.73_m2} (ref >=60)
Globulin: 3.1 g/dL (calc) (ref 1.9–3.7)
Glucose, Bld: 81 mg/dL (ref 65–99)
Potassium: 3.9 mmol/L (ref 3.5–5.3)
Sodium: 136 mmol/L (ref 135–146)
Total Bilirubin: 0.2 mg/dL (ref 0.2–1.2)
Total Protein: 7.4 g/dL (ref 6.1–8.1)

## 2021-01-11 LAB — VITAMIN D 25 HYDROXY (VIT D DEFICIENCY, FRACTURES): Vit D, 25-Hydroxy: 55 ng/mL (ref 30–100)

## 2021-01-11 LAB — LIPID PANEL
Cholesterol: 193 mg/dL (ref <200)
HDL: 79 mg/dL (ref >=50)
LDL Cholesterol (Calc): 93 mg/dL (calc)
Non-HDL Cholesterol (Calc): 114 mg/dL (calc) (ref <130)
Total CHOL/HDL Ratio: 2.4 (calc) (ref <5.0)
Triglycerides: 109 mg/dL (ref <150)

## 2021-02-22 ENCOUNTER — Encounter: Payer: Self-pay | Admitting: Radiology

## 2021-05-16 ENCOUNTER — Other Ambulatory Visit: Payer: Self-pay | Admitting: Internal Medicine

## 2021-05-16 DIAGNOSIS — M545 Low back pain, unspecified: Secondary | ICD-10-CM | POA: Diagnosis not present

## 2021-05-16 DIAGNOSIS — D649 Anemia, unspecified: Secondary | ICD-10-CM | POA: Diagnosis not present

## 2021-05-16 DIAGNOSIS — I10 Essential (primary) hypertension: Secondary | ICD-10-CM | POA: Diagnosis not present

## 2021-05-17 LAB — CBC
HCT: 31.2 % — ABNORMAL LOW (ref 35.0–45.0)
Hemoglobin: 9.6 g/dL — ABNORMAL LOW (ref 11.7–15.5)
MCH: 22.2 pg — ABNORMAL LOW (ref 27.0–33.0)
MCHC: 30.8 g/dL — ABNORMAL LOW (ref 32.0–36.0)
MCV: 72.1 fL — ABNORMAL LOW (ref 80.0–100.0)
MPV: 12 fL (ref 7.5–12.5)
Platelets: 184 10*3/uL (ref 140–400)
RBC: 4.33 10*6/uL (ref 3.80–5.10)
RDW: 14.8 % (ref 11.0–15.0)
WBC: 6 10*3/uL (ref 3.8–10.8)

## 2021-05-17 LAB — IRON, TOTAL/TOTAL IRON BINDING CAP
%SAT: 23 % (calc) (ref 16–45)
Iron: 64 ug/dL (ref 40–190)
TIBC: 281 mcg/dL (calc) (ref 250–450)

## 2021-05-17 LAB — FOLATE: Folate: 15.8 ng/mL

## 2021-05-17 LAB — VITAMIN B12: Vitamin B-12: 772 pg/mL (ref 200–1100)

## 2021-05-17 LAB — FERRITIN: Ferritin: 21 ng/mL (ref 16–232)

## 2021-05-17 LAB — SICKLE CELL SCREEN: Sickle Solubility Test - HGBRFX: NEGATIVE

## 2021-10-07 DIAGNOSIS — E669 Obesity, unspecified: Secondary | ICD-10-CM | POA: Diagnosis not present

## 2021-10-07 DIAGNOSIS — M545 Low back pain, unspecified: Secondary | ICD-10-CM | POA: Diagnosis not present

## 2021-10-07 DIAGNOSIS — D649 Anemia, unspecified: Secondary | ICD-10-CM | POA: Diagnosis not present

## 2021-10-07 DIAGNOSIS — I10 Essential (primary) hypertension: Secondary | ICD-10-CM | POA: Diagnosis not present

## 2022-01-09 ENCOUNTER — Other Ambulatory Visit: Payer: Self-pay | Admitting: Internal Medicine

## 2022-01-09 DIAGNOSIS — Z131 Encounter for screening for diabetes mellitus: Secondary | ICD-10-CM | POA: Diagnosis not present

## 2022-01-09 DIAGNOSIS — E559 Vitamin D deficiency, unspecified: Secondary | ICD-10-CM | POA: Diagnosis not present

## 2022-01-09 DIAGNOSIS — D649 Anemia, unspecified: Secondary | ICD-10-CM | POA: Diagnosis not present

## 2022-01-09 DIAGNOSIS — Z0000 Encounter for general adult medical examination without abnormal findings: Secondary | ICD-10-CM | POA: Diagnosis not present

## 2022-01-09 DIAGNOSIS — Z13220 Encounter for screening for lipoid disorders: Secondary | ICD-10-CM | POA: Diagnosis not present

## 2022-01-09 DIAGNOSIS — I10 Essential (primary) hypertension: Secondary | ICD-10-CM | POA: Diagnosis not present

## 2022-01-09 DIAGNOSIS — N390 Urinary tract infection, site not specified: Secondary | ICD-10-CM | POA: Diagnosis not present

## 2022-01-10 LAB — URINE CULTURE
MICRO NUMBER:: 13542530
SPECIMEN QUALITY:: ADEQUATE

## 2022-01-10 LAB — CBC
HCT: 33.6 % — ABNORMAL LOW (ref 35.0–45.0)
Hemoglobin: 10.1 g/dL — ABNORMAL LOW (ref 11.7–15.5)
MCH: 21.4 pg — ABNORMAL LOW (ref 27.0–33.0)
MCHC: 30.1 g/dL — ABNORMAL LOW (ref 32.0–36.0)
MCV: 71.2 fL — ABNORMAL LOW (ref 80.0–100.0)
MPV: 12 fL (ref 7.5–12.5)
Platelets: 226 10*3/uL (ref 140–400)
RBC: 4.72 10*6/uL (ref 3.80–5.10)
RDW: 15.3 % — ABNORMAL HIGH (ref 11.0–15.0)
WBC: 5.9 10*3/uL (ref 3.8–10.8)

## 2022-01-10 LAB — COMPLETE METABOLIC PANEL WITH GFR
AG Ratio: 1.6 (calc) (ref 1.0–2.5)
ALT: 12 U/L (ref 6–29)
AST: 15 U/L (ref 10–30)
Albumin: 4.5 g/dL (ref 3.6–5.1)
Alkaline phosphatase (APISO): 42 U/L (ref 31–125)
BUN: 14 mg/dL (ref 7–25)
CO2: 27 mmol/L (ref 20–32)
Calcium: 9.1 mg/dL (ref 8.6–10.2)
Chloride: 102 mmol/L (ref 98–110)
Creat: 0.98 mg/dL (ref 0.50–0.99)
Globulin: 2.9 g/dL (calc) (ref 1.9–3.7)
Glucose, Bld: 80 mg/dL (ref 65–99)
Potassium: 4.2 mmol/L (ref 3.5–5.3)
Sodium: 136 mmol/L (ref 135–146)
Total Bilirubin: 0.3 mg/dL (ref 0.2–1.2)
Total Protein: 7.4 g/dL (ref 6.1–8.1)
eGFR: 73 mL/min/{1.73_m2} (ref >=60)

## 2022-01-10 LAB — LIPID PANEL
Cholesterol: 213 mg/dL — ABNORMAL HIGH (ref <200)
HDL: 72 mg/dL (ref >=50)
LDL Cholesterol (Calc): 113 mg/dL (calc) — ABNORMAL HIGH
Non-HDL Cholesterol (Calc): 141 mg/dL (calc) — ABNORMAL HIGH (ref <130)
Total CHOL/HDL Ratio: 3 (calc) (ref <5.0)
Triglycerides: 165 mg/dL — ABNORMAL HIGH (ref <150)

## 2022-01-10 LAB — VITAMIN D 25 HYDROXY (VIT D DEFICIENCY, FRACTURES): Vit D, 25-Hydroxy: 34 ng/mL (ref 30–100)

## 2022-01-10 LAB — TSH: TSH: 2.52 mIU/L

## 2022-01-19 ENCOUNTER — Encounter: Payer: Self-pay | Admitting: Internal Medicine

## 2022-03-23 ENCOUNTER — Other Ambulatory Visit: Payer: Self-pay | Admitting: Internal Medicine

## 2022-03-23 DIAGNOSIS — Z1231 Encounter for screening mammogram for malignant neoplasm of breast: Secondary | ICD-10-CM

## 2022-04-12 ENCOUNTER — Ambulatory Visit
Admission: RE | Admit: 2022-04-12 | Discharge: 2022-04-12 | Disposition: A | Discharge status: Home / Self Care | Payer: BC Managed Care – PPO | Source: Ambulatory Visit | Attending: Internal Medicine | Admitting: Internal Medicine

## 2022-04-12 DIAGNOSIS — Z1231 Encounter for screening mammogram for malignant neoplasm of breast: Secondary | ICD-10-CM | POA: Diagnosis not present

## 2022-06-12 DIAGNOSIS — M545 Low back pain, unspecified: Secondary | ICD-10-CM | POA: Diagnosis not present

## 2022-06-12 DIAGNOSIS — I10 Essential (primary) hypertension: Secondary | ICD-10-CM | POA: Diagnosis not present

## 2022-06-12 DIAGNOSIS — R071 Chest pain on breathing: Secondary | ICD-10-CM | POA: Diagnosis not present

## 2022-11-17 ENCOUNTER — Ambulatory Visit (HOSPITAL_COMMUNITY)
Admission: EM | Admit: 2022-11-17 | Discharge: 2022-11-17 | Disposition: A | Discharge status: Home / Self Care | Payer: BC Managed Care – PPO | Attending: Nurse Practitioner | Admitting: Nurse Practitioner

## 2022-11-17 ENCOUNTER — Encounter (HOSPITAL_COMMUNITY): Payer: Self-pay

## 2022-11-17 DIAGNOSIS — J069 Acute upper respiratory infection, unspecified: Secondary | ICD-10-CM

## 2022-11-17 DIAGNOSIS — R6883 Chills (without fever): Secondary | ICD-10-CM | POA: Diagnosis not present

## 2022-11-17 MED ORDER — BENZONATATE 100 MG PO CAPS: 100.0000 mg | ORAL_CAPSULE | Freq: Three times a day (TID) | ORAL | 0 refills | Status: AC | PRN

## 2022-11-17 NOTE — ED Provider Notes (Signed)
[facility]    CSN: [id number] Arrival date & time: 11/17/22  1753      History   Chief Complaint Chief Complaint  Patient presents with   Sore Throat   Cough   Chills    HPI Vanessa Gates is a 47 y.o. female.   Patient presents today with 1 day history of bodyaches, chills, dry cough, sore throat, decreased appetite.  She denies known fevers at home, shortness of breath or chest pain, runny or stuffy nose, postnasal drainage, headache, ear pain, abdominal pain, nausea/vomiting, diarrhea, loss of taste or smell, new rash, and fatigue.  No known sick contacts.  She has taken ibuprofen 200 mg today which did not help with symptoms.    Past Medical History:  Diagnosis Date   Anemia    Hypertension     Patient Active Problem List   Diagnosis Date Noted   Benign essential hypertension 04/15/2020    Past Surgical History:  Procedure Laterality Date   BREAST CYST EXCISION Right     OB History     Gravida  2   Para  2   Term      Preterm      AB      Living  2      SAB      IAB      Ectopic      Multiple      Live Births  2            Home Medications    Prior to Admission medications   Medication Sig Start Date End Date Taking? Authorizing Provider  benzonatate (TESSALON) 100 MG capsule Take 1 capsule (100 mg total) by mouth 3 (three) times daily as needed for cough. Do not take with alcohol or while driving or operating heavy machinery.  May cause drowsiness. 11/17/22  Yes Cathlean Marseilles A, NP  amLODipine (NORVASC) 10 MG tablet Take 10 mg by mouth daily. 03/15/20   [provider]  ASPIRIN LOW DOSE 81 MG EC tablet Take 81 mg by mouth daily. 03/18/20   [provider]  ibuprofen (ADVIL) 800 MG tablet Take 800 mg by mouth 2 (two) times daily as needed. 04/05/20   [provider]  Iron-FA-B Cmp-C-Biot-Probiotic (FUSION PLUS) CAPS Take 1 capsule by mouth daily. 04/06/20   [provider]  Vitamin  D, Ergocalciferol, (DRISDOL) 1.25 MG (50000 UNIT) CAPS capsule Take 50,000 Units by mouth once a week. 04/12/20   [provider]    Family History History reviewed. No pertinent family history.  Social History Social History   Tobacco Use   Smoking status: Never   Smokeless tobacco: Never  Vaping Use   Vaping Use: Never used  Substance Use Topics   Alcohol use: Not Currently   Drug use: Not Currently     Allergies   Patient has no known allergies.   Review of Systems Review of Systems Per HPI  Physical Exam Triage Vital Signs ED Triage Vitals [11/17/22 1910]  Enc Vitals Group     BP 132/87     Pulse Rate (!) 105     Resp 18     Temp 98.9 F (37.2 C)     Temp Source Oral     SpO2 96 %     Weight      Height      Head Circumference      Peak Flow      Pain Score 3  Pain Loc      Pain Edu?      Excl. in GC?    No data found.  Updated Vital Signs BP 132/87 (BP Location: Right Arm)   Pulse (!) 105   Temp 98.9 F (37.2 C) (Oral)   Resp 18   LMP 11/17/2022   SpO2 96%   Visual Acuity Right Eye Distance:   Left Eye Distance:   Bilateral Distance:    Right Eye Near:   Left Eye Near:    Bilateral Near:     Physical Exam Vitals and nursing note reviewed.  Constitutional:      General: She is not in acute distress.    Appearance: Normal appearance. She is not ill-appearing or toxic-appearing.  HENT:     Head: Normocephalic and atraumatic.     Right Ear: Tympanic membrane, ear canal and external ear normal. No drainage, swelling or tenderness. No middle ear effusion. Tympanic membrane is not erythematous.     Left Ear: Tympanic membrane, ear canal and external ear normal. No drainage, swelling or tenderness.  No middle ear effusion. Tympanic membrane is not erythematous.     Nose: Rhinorrhea present. No congestion.     Mouth/Throat:     Mouth: Mucous membranes are moist.     Pharynx: Oropharynx is clear. No oropharyngeal exudate or  posterior oropharyngeal erythema.     Tonsils: No tonsillar exudate. 0 on the right. 0 on the left.     Comments: Postnasal drainage Eyes:     General: No scleral icterus.    Extraocular Movements: Extraocular movements intact.     Right eye: Normal extraocular motion.     Left eye: Normal extraocular motion.  Cardiovascular:     Rate and Rhythm: Regular rhythm. Tachycardia present.  Pulmonary:     Effort: Pulmonary effort is normal. No respiratory distress.     Breath sounds: Normal breath sounds. No wheezing, rhonchi or rales.  Abdominal:     General: Abdomen is flat. Bowel sounds are normal. There is no distension.     Palpations: Abdomen is soft.  Musculoskeletal:     Cervical back: Normal range of motion and neck supple.  Lymphadenopathy:     Cervical: No cervical adenopathy.  Skin:    General: Skin is warm and dry.     Coloration: Skin is not jaundiced or pale.     Findings: No erythema or rash.  Neurological:     Mental Status: She is alert and oriented to person, place, and time.     Motor: No weakness.  Psychiatric:        Behavior: Behavior is cooperative.      UC Treatments / Results  Labs (all labs ordered are listed, but only abnormal results are displayed) Labs Reviewed - No data to display  EKG   Radiology No results found.  Procedures Procedures (including critical care time)  Medications Ordered in UC Medications - No data to display  Initial Impression / Assessment and Plan / UC Course  I have reviewed the triage vital signs and the nursing notes.  Pertinent labs & imaging results that were available during my care of the patient were reviewed by me and considered in my medical decision making (see chart for details).   Patient is well-appearing, normotensive, afebrile, not tachypneic, oxygenating well on room air.  Patient is mildly tachycardic in triage today.  1. Chills 2. Viral URI Suspect viral etiology Vital signs and examination  today are reassuring Patient declines  COVID-19 testing Supportive care discussed with patient Can start cough Perles as needed for dry cough Recommended warm fluids, throat lozenges, lemon/honey as needed for throat pain Seek care for persistent worsening symptoms despite treatment  The patient was given the opportunity to ask questions.  All questions answered to their satisfaction.  The patient is in agreement to this plan.    Final Clinical Impressions(s) / UC Diagnoses   Final diagnoses:  Chills  Viral URI     Discharge Instructions      You have a viral upper respiratory infection.  Symptoms should improve over the next week to 10 days.  If you develop chest pain or shortness of breath, go to the emergency room.  Some things that can make you feel better are: - Increased rest - Increasing fluid with water/sugar free electrolytes - Acetaminophen [phone] mg every 6 hours and ibuprofen 400-600 mg every 8 hours as needed for fever/pain - Salt water gargling, chloraseptic spray and throat lozenges for sore throat - OTC guaifenesin (Mucinex) 600 mg twice daily for congestion - Saline sinus flushes or a neti pot - Humidifying the air -Tessalon Perles during the day as needed for dry cough     ED Prescriptions     Medication Sig Dispense Auth. Provider   benzonatate (TESSALON) 100 MG capsule Take 1 capsule (100 mg total) by mouth 3 (three) times daily as needed for cough. Do not take with alcohol or while driving or operating heavy machinery.  May cause drowsiness. 21 capsule Valentino Nose, NP      PDMP not reviewed this encounter.   Valentino Nose, NP 11/17/22 [phone]

## 2022-11-17 NOTE — Discharge Instructions (Signed)
You have a viral upper respiratory infection.  Symptoms should improve over the next week to 10 days.  If you develop chest pain or shortness of breath, go to the emergency room.  Some things that can make you feel better are: - Increased rest - Increasing fluid with water/sugar free electrolytes - Acetaminophen [phone] mg every 6 hours and ibuprofen 400-600 mg every 8 hours as needed for fever/pain - Salt water gargling, chloraseptic spray and throat lozenges for sore throat - OTC guaifenesin (Mucinex) 600 mg twice daily for congestion - Saline sinus flushes or a neti pot - Humidifying the air -Tessalon Perles during the day as needed for dry cough

## 2022-11-17 NOTE — ED Triage Notes (Signed)
Patient c/o sore throat, a non productive cough, and chills since last night.  Patient states she took Ibuprofen 200 mg at 1200 today.

## 2023-01-09 ENCOUNTER — Other Ambulatory Visit: Payer: Self-pay | Admitting: Internal Medicine

## 2023-01-09 DIAGNOSIS — E559 Vitamin D deficiency, unspecified: Secondary | ICD-10-CM | POA: Diagnosis not present

## 2023-01-09 DIAGNOSIS — Z131 Encounter for screening for diabetes mellitus: Secondary | ICD-10-CM | POA: Diagnosis not present

## 2023-01-09 DIAGNOSIS — Z13220 Encounter for screening for lipoid disorders: Secondary | ICD-10-CM | POA: Diagnosis not present

## 2023-01-09 DIAGNOSIS — Z0000 Encounter for general adult medical examination without abnormal findings: Secondary | ICD-10-CM | POA: Diagnosis not present

## 2023-01-09 DIAGNOSIS — I10 Essential (primary) hypertension: Secondary | ICD-10-CM | POA: Diagnosis not present

## 2023-01-09 DIAGNOSIS — E669 Obesity, unspecified: Secondary | ICD-10-CM | POA: Diagnosis not present

## 2023-01-09 DIAGNOSIS — M545 Low back pain, unspecified: Secondary | ICD-10-CM | POA: Diagnosis not present

## 2023-01-09 DIAGNOSIS — N390 Urinary tract infection, site not specified: Secondary | ICD-10-CM | POA: Diagnosis not present

## 2023-01-10 LAB — URINE CULTURE

## 2023-01-10 LAB — LIPID PANEL
Cholesterol: 201 mg/dL — ABNORMAL HIGH (ref <200)
HDL: 76 mg/dL (ref >=50)
LDL Cholesterol (Calc): 104 mg/dL (calc) — ABNORMAL HIGH
Non-HDL Cholesterol (Calc): 125 mg/dL (calc) (ref <130)
Total CHOL/HDL Ratio: 2.6 (calc) (ref <5.0)
Triglycerides: 117 mg/dL (ref <150)

## 2023-01-10 LAB — CBC
HCT: 31.4 % — ABNORMAL LOW (ref 35.0–45.0)
Hemoglobin: 9.6 g/dL — ABNORMAL LOW (ref 11.7–15.5)
MCH: 21.8 pg — ABNORMAL LOW (ref 27.0–33.0)
MCHC: 30.6 g/dL — ABNORMAL LOW (ref 32.0–36.0)
MCV: 71.4 fL — ABNORMAL LOW (ref 80.0–100.0)
MPV: 11.4 fL (ref 7.5–12.5)
Platelets: 211 10*3/uL (ref 140–400)
RBC: 4.4 10*6/uL (ref 3.80–5.10)
RDW: 15.8 % — ABNORMAL HIGH (ref 11.0–15.0)
WBC: 6.1 10*3/uL (ref 3.8–10.8)

## 2023-01-10 LAB — COMPLETE METABOLIC PANEL WITH GFR
AG Ratio: 1.4 (calc) (ref 1.0–2.5)
ALT: 6 U/L (ref 6–29)
AST: 15 U/L (ref 10–35)
Albumin: 4.2 g/dL (ref 3.6–5.1)
Alkaline phosphatase (APISO): 42 U/L (ref 31–125)
BUN: 17 mg/dL (ref 7–25)
CO2: 24 mmol/L (ref 20–32)
Calcium: 8.9 mg/dL (ref 8.6–10.2)
Chloride: 101 mmol/L (ref 98–110)
Creat: 0.87 mg/dL (ref 0.50–0.99)
Globulin: 2.9 g/dL (calc) (ref 1.9–3.7)
Glucose, Bld: 90 mg/dL (ref 65–99)
Potassium: 3.9 mmol/L (ref 3.5–5.3)
Sodium: 135 mmol/L (ref 135–146)
Total Bilirubin: 0.3 mg/dL (ref 0.2–1.2)
Total Protein: 7.1 g/dL (ref 6.1–8.1)
eGFR: 84 mL/min/{1.73_m2} (ref >=60)

## 2023-01-10 LAB — TSH: TSH: 1.58 mIU/L

## 2023-01-10 LAB — VITAMIN D 25 HYDROXY (VIT D DEFICIENCY, FRACTURES): Vit D, 25-Hydroxy: 29 ng/mL — ABNORMAL LOW (ref 30–100)

## 2023-02-20 DIAGNOSIS — I10 Essential (primary) hypertension: Secondary | ICD-10-CM | POA: Diagnosis not present

## 2023-02-20 DIAGNOSIS — D649 Anemia, unspecified: Secondary | ICD-10-CM | POA: Diagnosis not present

## 2023-02-20 DIAGNOSIS — R39198 Other difficulties with micturition: Secondary | ICD-10-CM | POA: Diagnosis not present

## 2023-06-04 ENCOUNTER — Other Ambulatory Visit: Payer: Self-pay | Admitting: Internal Medicine

## 2023-06-04 DIAGNOSIS — Z1231 Encounter for screening mammogram for malignant neoplasm of breast: Secondary | ICD-10-CM

## 2023-06-29 ENCOUNTER — Ambulatory Visit: Payer: BC Managed Care – PPO

## 2023-07-02 DIAGNOSIS — N390 Urinary tract infection, site not specified: Secondary | ICD-10-CM | POA: Diagnosis not present

## 2023-07-02 DIAGNOSIS — I10 Essential (primary) hypertension: Secondary | ICD-10-CM | POA: Diagnosis not present

## 2023-07-02 DIAGNOSIS — D649 Anemia, unspecified: Secondary | ICD-10-CM | POA: Diagnosis not present

## 2023-07-02 DIAGNOSIS — M545 Low back pain, unspecified: Secondary | ICD-10-CM | POA: Diagnosis not present

## 2023-09-19 ENCOUNTER — Ambulatory Visit (INDEPENDENT_AMBULATORY_CARE_PROVIDER_SITE_OTHER): Payer: BC Managed Care – PPO | Admitting: Family Medicine

## 2023-09-19 ENCOUNTER — Other Ambulatory Visit (HOSPITAL_COMMUNITY)
Admission: RE | Admit: 2023-09-19 | Discharge: 2023-09-19 | Disposition: A | Discharge status: Home / Self Care | Source: Ambulatory Visit | Attending: Family Medicine | Admitting: Family Medicine

## 2023-09-19 ENCOUNTER — Encounter: Payer: Self-pay | Admitting: Family Medicine

## 2023-09-19 VITALS — BP 142/89 | HR 76 | Ht 65.0 in | Wt 191.8 lb

## 2023-09-19 DIAGNOSIS — I10 Essential (primary) hypertension: Secondary | ICD-10-CM | POA: Diagnosis not present

## 2023-09-19 DIAGNOSIS — Z124 Encounter for screening for malignant neoplasm of cervix: Secondary | ICD-10-CM

## 2023-09-19 DIAGNOSIS — Z113 Encounter for screening for infections with a predominantly sexual mode of transmission: Secondary | ICD-10-CM | POA: Insufficient documentation

## 2023-09-19 DIAGNOSIS — Z1211 Encounter for screening for malignant neoplasm of colon: Secondary | ICD-10-CM

## 2023-09-19 DIAGNOSIS — Z01419 Encounter for gynecological examination (general) (routine) without abnormal findings: Secondary | ICD-10-CM | POA: Diagnosis not present

## 2023-09-19 NOTE — Progress Notes (Signed)
 Patient presents for Annual.  LMP: 09/14/23 Last pap: Date: 2021?  Contraception: None Mammogram: Due, Last Mammo 2023  STD Screening: Accepts Flu Vaccine : Declines  CC:  Annual

## 2023-09-19 NOTE — Progress Notes (Signed)
 Subjective:     Vanessa Gates is a 48 y.o. female and is here for a comprehensive physical exam. The patient reports no problems.      The following portions of the patient's history were reviewed and updated as appropriate: allergies, current medications, past family history, past medical history, past social history, past surgical history, and problem list.  Review of Systems Pertinent items noted in HPI and remainder of comprehensive ROS otherwise negative.   Objective:  Chaperone present for exam   BP (!) 142/89   Pulse 76   Ht 5\' 5"  (1.651 m)   Wt 191 lb 12.8 oz (87 kg)   BMI 31.92 kg/m  General appearance: alert, cooperative, and appears stated age Head: Normocephalic, without obvious abnormality, atraumatic Neck: no adenopathy, supple, symmetrical, trachea midline, and thyroid not enlarged, symmetric, no tenderness/mass/nodules Lungs: clear to auscultation bilaterally Breasts: normal appearance, no masses or tenderness Heart: regular rate and rhythm, S1, S2 normal, no murmur, click, rub or gallop Abdomen: soft, non-tender; bowel sounds normal; no masses,  no organomegaly Pelvic: cervix normal in appearance, external genitalia normal, no adnexal masses or tenderness, no cervical motion tenderness, uterus normal size, shape, and consistency, and vagina normal without discharge Extremities: extremities normal, atraumatic, no cyanosis or edema Pulses: 2+ and symmetric Skin: Skin color, texture, turgor normal. No rashes or lesions Lymph nodes: Cervical, supraclavicular, and axillary nodes normal. Neurologic: Grossly normal    Assessment:    Healthy female exam.     Plan:  Encounter for gynecological examination without abnormal finding - declines mammogram, last WNL in 9/23.  Screening for malignant neoplasm of cervix - Plan: Cytology - PAP  Benign essential hypertension  Screen for colon cancer - Plan: Cologuard    See After Visit Summary for Counseling  Recommendations

## 2023-09-25 LAB — CYTOLOGY - PAP
Chlamydia: NEGATIVE
Comment: NEGATIVE
Comment: NEGATIVE
Comment: NEGATIVE
Comment: NORMAL
High risk HPV: NEGATIVE
Neisseria Gonorrhea: NEGATIVE
Trichomonas: NEGATIVE

## 2023-10-01 ENCOUNTER — Telehealth: Payer: Self-pay | Admitting: *Deleted

## 2023-10-01 NOTE — Telephone Encounter (Signed)
 Called pt, informed her of pap results and scheduled her colpo for 10/24/23

## 2023-10-01 NOTE — Telephone Encounter (Signed)
-----   Message from Reva Bores sent at 09/25/2023  1:52 PM EST ----- Needs colpo and endometrial sampling due to atypical pap findings.

## 2023-10-03 DIAGNOSIS — Z1211 Encounter for screening for malignant neoplasm of colon: Secondary | ICD-10-CM | POA: Diagnosis not present

## 2023-10-10 LAB — COLOGUARD: COLOGUARD: NEGATIVE

## 2023-10-24 ENCOUNTER — Encounter: Admitting: Family Medicine

## 2024-04-16 ENCOUNTER — Ambulatory Visit (HOSPITAL_COMMUNITY)
Admission: EM | Admit: 2024-04-16 | Discharge: 2024-04-16 | Disposition: A | Discharge status: Home / Self Care | Attending: Family Medicine | Admitting: Family Medicine

## 2024-04-16 ENCOUNTER — Encounter (HOSPITAL_COMMUNITY): Payer: Self-pay | Admitting: Emergency Medicine

## 2024-04-16 DIAGNOSIS — M62830 Muscle spasm of back: Secondary | ICD-10-CM | POA: Diagnosis not present

## 2024-04-16 MED ORDER — METHOCARBAMOL 500 MG PO TABS: 1000.0000 mg | ORAL_TABLET | Freq: Three times a day (TID) | ORAL | 0 refills | Status: AC | PRN

## 2024-04-16 NOTE — ED Triage Notes (Signed)
 Pt c/o right rib pain that started yesterday. Taking ibuprofen. Denies lifting, injury etc.

## 2024-04-16 NOTE — ED Provider Notes (Signed)
 [facility]   [id number] 04/16/24 Arrival Time: 0831  ASSESSMENT & PLAN:  1. Muscle spasm of back     Muscle spasm of thoracic spine. Likely from overuse. Counseled patient on proper body mechanics and stretching. Continue ibuprofen OTC, use of heat therapy, and massage. Will start methocarbamol  1,000 mg three times daily PRN for muscle spasm. Sedation precautions given. If symptoms persist follow up with PCP for consideration of physical therapy. Red flag symptoms reviewed and return precautions given.  Able to ambulate here and hemodynamically stable. No indication for imaging of back at this time given no trauma and normal neurological exam. Discussed.  Meds ordered this encounter  Medications   methocarbamol  (ROBAXIN ) 500 MG tablet    Sig: Take 2 tablets (1,000 mg total) by mouth every 8 (eight) hours as needed for muscle spasms.    Dispense:  30 tablet    Refill:  0   Work/school excuse note: provided. Medication sedation precautions given. Encourage ROM/movement as tolerated.  Recommend:  Follow-up Information     Schedule an appointment as soon as possible for a visit  with Shelda Atlas, MD.   Specialty: Internal Medicine Why: For follow up. Contact information: [address] [place] [address] [phone]                 Reviewed expectations re: course of current medical issues. Questions answered. Outlined signs and symptoms indicating need for more acute intervention. Patient verbalized understanding. After Visit Summary given.   SUBJECTIVE: History from: patient.  Vanessa Gates is a 48 y.o. female who presents with complaint of R sided back pain. Pt has a hx of HTN and anemia. She presents with R sided mid-back pain since Monday night (9/22). She states the pain hurts worse when she moves around and when she takes a deep breath. The pain is described as a grabbing pain and it is non-radiating. She indicates that it is in the  paraspinal area around T7-T10. She denies any fever, chills, rash, or urinary symptoms. She denies any specific injury. 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 She also does bending and stooping with house chores. She reports this happened several years ago when she was living in [place] and her doctor attributed the pain to her posture.   Reports no chronic steroid use, fevers, IV drug use, or recent back surgeries or procedures.  ROS: As per HPI. All other systems negative.   OBJECTIVE:  Vitals:   04/16/24 0848  BP: 123/83  Pulse: 96  Resp: 18  Temp: 98.3 F (36.8 C)  TempSrc: Oral  SpO2: 96%    General appearance: alert; no distress Cardiovascular:     Rate and Rhythm: Normal rate and regular rhythm.     Heart sounds: Normal heart sounds.  Pulmonary:     Effort: Pulmonary effort is normal.     Breath sounds: Normal breath sounds.  Musculoskeletal:     Thoracic back: Spasms present. No bony tenderness.     Lumbar back: Negative right straight leg raise test and negative left straight leg raise test.       Back:   Labs: Results for orders placed or performed in visit on 09/19/23  Cytology - PAP   Collection Time: 09/19/23  2:32 PM  Result Value Ref Range   High risk HPV Negative    Neisseria Gonorrhea Negative    Chlamydia Negative    Trichomonas Negative    Adequacy  Satisfactory for evaluation; transformation zone component PRESENT.   Diagnosis - Atypical glandular cells, NOS (A)    Microorganisms Shift in flora suggestive of bacterial vaginosis    Comment Normal Reference Ranger Chlamydia - Negative    Comment      Normal Reference Range Neisseria Gonorrhea - Negative   Comment Normal Reference Range Trichomonas - Negative    Comment Normal Reference Range HPV - Negative   Cologuard   Collection Time: 10/03/23  6:35 PM  Result Value Ref Range   COLOGUARD Negative Negative   Labs Reviewed - No data to display  Imaging: No results  found.  No Known Allergies  Past Medical History:  Diagnosis Date   Anemia    Hypertension    Social History   Socioeconomic History   Marital status: Married    Spouse name: Not on file   Number of children: Not on file   Years of education: Not on file   Highest education level: Not on file  Occupational History   Not on file  Tobacco Use   Smoking status: Never   Smokeless tobacco: Never  Vaping Use   Vaping status: Never Used  Substance and Sexual Activity   Alcohol use: Not Currently   Drug use: Not Currently   Sexual activity: Yes    Birth control/protection: None  Other Topics Concern   Not on file  Social History Narrative   Not on file   Social Drivers of Health   Financial Resource Strain: Not on file  Food Insecurity: Not on file  Transportation Needs: Not on file  Physical Activity: Not on file  Stress: Not on file  Social Connections: Not on file  Intimate Partner Violence: Not on file   No family history on file. Past Surgical History:  Procedure Laterality Date   BREAST CYST EXCISION Right       Rolinda Rogue, MD 04/16/24 1123

## 2024-04-16 NOTE — Medical Student Note (Signed)
 [employer] account manager Note For educational purposes for Medical, PA and NP students only and not part of the legal medical record.   CSN: [id number] Arrival date & time: 04/16/24  0831      History   Chief Complaint Chief Complaint  Patient presents with   Chest Pain    HPI Vanessa Gates is a 48 y.o. female.  Pt has a hx of HTN and anemia. She presents with R sided mid-back pain since Monday night (9/22). She states the pain hurts worse when she moves around and when she takes a deep breath. The pain is described as a grabbing pain and it is non-radiating. She indicates that it is in the paraspinal area around T7-T10. She denies any fever, chills, rash, or urinary symptoms. She denies any specific injury. 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 She also does bending and stooping with house chores. She reports this happened several years ago when she was living in [place] and her doctor attributed the pain to her posture.   The history is provided by the patient.  Chest Pain Associated symptoms: back pain     Past Medical History:  Diagnosis Date   Anemia    Hypertension     Patient Active Problem List   Diagnosis Date Noted   Benign essential hypertension 04/15/2020    Past Surgical History:  Procedure Laterality Date   BREAST CYST EXCISION Right     OB History     Gravida  2   Para  2   Term      Preterm      AB      Living  2      SAB      IAB      Ectopic      Multiple      Live Births  2            Home Medications    Prior to Admission medications   Medication Sig Start Date End Date Taking? Authorizing Provider  amLODipine (NORVASC) 10 MG tablet Take 10 mg by mouth daily. 03/15/20   [provider]  ASPIRIN LOW DOSE 81 MG EC tablet Take 81 mg by mouth daily. 03/18/20   [provider]  benzonatate  (TESSALON ) 100 MG capsule Take 1 capsule (100 mg total) by mouth 3 (three)  times daily as needed for cough. Do not take with alcohol or while driving or operating heavy machinery.  May cause drowsiness. Patient not taking: Reported on 09/19/2023 11/17/22   Chandra Raisin A, NP  ibuprofen (ADVIL) 800 MG tablet Take 800 mg by mouth 2 (two) times daily as needed. 04/05/20   [provider]  Iron-FA-B Cmp-C-Biot-Probiotic (FUSION PLUS) CAPS Take 1 capsule by mouth daily. 04/06/20   [provider]  Vitamin D , Ergocalciferol , (DRISDOL) 1.25 MG (50000 UNIT) CAPS capsule Take 50,000 Units by mouth once a week. 04/12/20   [provider]    Family History No family history on file.  Social History Social History   Tobacco Use   Smoking status: Never   Smokeless tobacco: Never  Vaping Use   Vaping status: Never Used  Substance Use Topics   Alcohol use: Not Currently   Drug use: Not Currently     Allergies   Patient has no known allergies.   Review of Systems Review of Systems  Genitourinary:  Negative for difficulty urinating, dysuria, frequency and urgency.  Musculoskeletal:  Positive for back pain.  All other systems reviewed and are negative.    Physical Exam Updated Vital Signs BP 123/83 (BP Location: Left Arm)   Pulse 96   Temp 98.3 F (36.8 C) (Oral)   Resp 18   LMP 03/28/2024 (Approximate)   SpO2 96%   Physical Exam Cardiovascular:     Rate and Rhythm: Normal rate and regular rhythm.     Heart sounds: Normal heart sounds.  Pulmonary:     Effort: Pulmonary effort is normal.     Breath sounds: Normal breath sounds.  Musculoskeletal:     Thoracic back: Spasms present. No bony tenderness.     Lumbar back: Negative right straight leg raise test and negative left straight leg raise test.       Back:     Comments: R lateral side bend ROM was limited by pain.   Neurological:     Mental Status: She is alert.      ED Treatments / Results  Labs (all labs ordered are listed, but only abnormal results are  displayed) Labs Reviewed - No data to display  EKG  Radiology No results found.  Procedures Procedures (including critical care time)  Medications Ordered in ED Medications - No data to display   Initial Impression / Assessment and Plan / ED Course  I have reviewed the triage vital signs and the nursing notes.  Pertinent labs & imaging results that were available during my care of the patient were reviewed by me and considered in my medical decision making (see chart for details).     Muscle spasm of thoracic spine. Likely from overuse. Counseled patient on proper body mechanics and stretching. Continue ibuprofen OTC, use of heat therapy, and massage. Will start methocarbamol  1,000 mg three times daily PRN for muscle spasm. Sedation precautions given. If symptoms persist follow up with PCP for consideration of physical therapy. Red flag symptoms reviewed and return precautions given.    Final Clinical Impressions(s) / ED Diagnoses   Final diagnoses:  None    New Prescriptions New Prescriptions   No medications on file

## 2024-04-17 ENCOUNTER — Other Ambulatory Visit: Payer: Self-pay

## 2024-04-17 ENCOUNTER — Emergency Department (HOSPITAL_COMMUNITY)
Admission: EM | Admit: 2024-04-17 | Discharge: 2024-04-17 | Disposition: A | Discharge status: Home / Self Care | Attending: Emergency Medicine | Admitting: Emergency Medicine

## 2024-04-17 ENCOUNTER — Encounter (HOSPITAL_COMMUNITY): Payer: Self-pay

## 2024-04-17 DIAGNOSIS — I10 Essential (primary) hypertension: Secondary | ICD-10-CM | POA: Diagnosis not present

## 2024-04-17 DIAGNOSIS — M62830 Muscle spasm of back: Secondary | ICD-10-CM | POA: Insufficient documentation

## 2024-04-17 DIAGNOSIS — M546 Pain in thoracic spine: Secondary | ICD-10-CM | POA: Diagnosis not present

## 2024-04-17 DIAGNOSIS — Z79899 Other long term (current) drug therapy: Secondary | ICD-10-CM | POA: Diagnosis not present

## 2024-04-17 DIAGNOSIS — Z7982 Long term (current) use of aspirin: Secondary | ICD-10-CM | POA: Insufficient documentation

## 2024-04-17 MED ORDER — OXYCODONE-ACETAMINOPHEN 5-325 MG PO TABS
1.0000 | ORAL_TABLET | ORAL | Status: DC | PRN
  Administered 2024-04-17: 1 via ORAL
  Filled 2024-04-17: qty 1

## 2024-04-17 MED ORDER — TRAMADOL HCL 50 MG PO TABS: 50.0000 mg | ORAL_TABLET | Freq: Four times a day (QID) | ORAL | 0 refills | Status: AC | PRN

## 2024-04-17 NOTE — ED Triage Notes (Signed)
 Pt presents via POV c/o mid thoracic back pain x2 days. Reports seen at Urgent Care for same yesterday and given PO Robaxin  to take without improvement.

## 2024-04-17 NOTE — ED Provider Notes (Signed)
 [place] EM[facility] DEPART[facility] AT [facility] Provider Note   CSN: [id number] Arrival date & time: 04/17/24  0146     Patient presents with: Back Pain   Vanessa Gates is a 48 y.o. female.   The history is provided by the patient and medical records.  Back Pain  48 year old female with history of hypertension, presenting to the ED with right-sided thoracic back pain for the past 2 days.  Denies falls/trauma or heavy lifting.  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  Reports it feels like her back is locked up.  Pain worse with movement.  Had similar episode of this a few years back in [place], states she cannot recall what they told her it was from.  Denies numbness/weakness.  No chest pain or SOB.  She was seen at [facility] yesterday and given robaxin  but not having any relief.  She was given oxycodone  in triage which seemed to help a little.  States she has not been able to sleep for 2 days due to discomfort.  Prior to Admission medications   Medication Sig Start Date End Date Taking? Authorizing Provider  traMADol  (ULTRAM ) 50 MG tablet Take 1 tablet (50 mg total) by mouth every 6 (six) hours as needed. 04/17/24  Yes Jarold Olam HERO, PA-C  amLODipine (NORVASC) 10 MG tablet Take 10 mg by mouth daily. 03/15/20   [provider]  ASPIRIN LOW DOSE 81 MG EC tablet Take 81 mg by mouth daily. 03/18/20   [provider]  benzonatate  (TESSALON ) 100 MG capsule Take 1 capsule (100 mg total) by mouth 3 (three) times daily as needed for cough. Do not take with alcohol or while driving or operating heavy machinery.  May cause drowsiness. Patient not taking: Reported on 09/19/2023 11/17/22   Chandra Raisin A, NP  ibuprofen (ADVIL) 800 MG tablet Take 800 mg by mouth 2 (two) times daily as needed. 04/05/20   [provider]  Iron-FA-B Cmp-C-Biot-Probiotic (FUSION PLUS) CAPS Take 1 capsule by mouth daily. 04/06/20   [provider]  methocarbamol  (ROBAXIN ) 500  MG tablet Take 2 tablets (1,000 mg total) by mouth every 8 (eight) hours as needed for muscle spasms. 04/16/24   Rolinda Rogue, MD  Vitamin D , Ergocalciferol , (DRISDOL) 1.25 MG (50000 UNIT) CAPS capsule Take 50,000 Units by mouth once a week. 04/12/20   [provider]    Allergies: Patient has no known allergies.    Review of Systems  Musculoskeletal:  Positive for back pain.  All other systems reviewed and are negative.   Updated Vital Signs BP (!) 158/97 (BP Location: Right Arm)   Pulse 93   Temp 99.1 F (37.3 C) (Oral)   Resp 19   LMP 03/28/2024 (Approximate)   SpO2 96%   Physical Exam Vitals and nursing note reviewed.  Constitutional:      Appearance: She is well-developed.  HENT:     Head: Normocephalic and atraumatic.  Eyes:     Conjunctiva/sclera: Conjunctivae normal.     Pupils: Pupils are equal, round, and reactive to light.  Cardiovascular:     Rate and Rhythm: Normal rate and regular rhythm.     Heart sounds: Normal heart sounds.  Pulmonary:     Effort: Pulmonary effort is normal.     Breath sounds: Normal breath sounds.  Musculoskeletal:        General: Normal range of motion.     Cervical back: Normal range of motion.  Back:     Comments: Right thoracic musculature with tenderness and obvious spasm present, seems to improve a little bit with massaging, normal ROM, no midline deformities or step off Normal strength/sensation of legs, normal gait  Skin:    General: Skin is warm and dry.  Neurological:     Mental Status: She is alert and oriented to person, place, and time.     (all labs ordered are listed, but only abnormal results are displayed) Labs Reviewed - No data to display  EKG: None  Radiology: No results found.   Procedures   Medications Ordered in the ED  oxyCODONE -acetaminophen  (PERCOCET/ROXICET) 5-325 MG per tablet 1 tablet (1 tablet Oral Patient Refused/Not Given 04/17/24 0450)                                     Medical Decision Making Risk Prescription drug management.   48 year old female presenting to the ED with continued right thoracic back pain.  Seen in urgent care yesterday and given Robaxin  without much change.  She has muscular tenderness on exam with obvious spasm.  I do not appreciate any midline step-off or deformity.  She has no focal neurologic deficits.  No red flag symptoms.  Reports history of the same in the past.  He did get some relief after oxycodone  in triage but has concerns about taking something this strong.  She is agreeable to short course of tramadol  to use concurrently with the Robaxin .  Encouraged heat therapy as well.  Can follow-up with PCP.  Return here for new concerns.  Final diagnoses:  Muscle spasm of back    ED Discharge Orders          Ordered    traMADol  (ULTRAM ) 50 MG tablet  Every 6 hours PRN        04/17/24 0436               Jarold Olam HERO, PA-C 04/17/24 0505    Carita Senior, MD 04/17/24 [phone]

## 2024-04-17 NOTE — Discharge Instructions (Addendum)
 Take the prescribed medication as directed.  Can continue the robaxin  with this.  Also can try heat therapy (heating pad, hot shower, etc). Follow-up with your primary care doctor. Return to the ED for new or worsening symptoms.

## 2024-04-18 DIAGNOSIS — E559 Vitamin D deficiency, unspecified: Secondary | ICD-10-CM | POA: Diagnosis not present

## 2024-04-18 DIAGNOSIS — N390 Urinary tract infection, site not specified: Secondary | ICD-10-CM | POA: Diagnosis not present

## 2024-04-18 DIAGNOSIS — R071 Chest pain on breathing: Secondary | ICD-10-CM | POA: Diagnosis not present

## 2024-04-18 DIAGNOSIS — Z131 Encounter for screening for diabetes mellitus: Secondary | ICD-10-CM | POA: Diagnosis not present

## 2024-04-18 DIAGNOSIS — Z13220 Encounter for screening for lipoid disorders: Secondary | ICD-10-CM | POA: Diagnosis not present

## 2024-04-18 DIAGNOSIS — I10 Essential (primary) hypertension: Secondary | ICD-10-CM | POA: Diagnosis not present

## 2024-04-18 DIAGNOSIS — Z0000 Encounter for general adult medical examination without abnormal findings: Secondary | ICD-10-CM | POA: Diagnosis not present

## 2024-04-28 DIAGNOSIS — I10 Essential (primary) hypertension: Secondary | ICD-10-CM | POA: Diagnosis not present

## 2024-04-28 DIAGNOSIS — M545 Low back pain, unspecified: Secondary | ICD-10-CM | POA: Diagnosis not present

## 2024-04-28 DIAGNOSIS — D649 Anemia, unspecified: Secondary | ICD-10-CM | POA: Diagnosis not present

## 2024-06-30 ENCOUNTER — Ambulatory Visit
Admission: RE | Admit: 2024-06-30 | Discharge: 2024-06-30 | Disposition: A | Source: Ambulatory Visit | Attending: Infectious Diseases | Admitting: Infectious Diseases

## 2024-06-30 ENCOUNTER — Other Ambulatory Visit: Payer: Self-pay | Admitting: Infectious Diseases

## 2024-06-30 DIAGNOSIS — R7611 Nonspecific reaction to tuberculin skin test without active tuberculosis: Secondary | ICD-10-CM

## 2024-07-21 DIAGNOSIS — R7612 Nonspecific reaction to cell mediated immunity measurement of gamma interferon antigen response without active tuberculosis: Secondary | ICD-10-CM | POA: Diagnosis not present
